# Patient Record
Sex: Female | Born: 1965 | Race: White | Hispanic: No | Marital: Single | Smoking: Former smoker
Health system: Southern US, Community
[De-identification: ages and names within clinical notes are randomized; demographics above are authoritative.]

## PROBLEM LIST (undated history)

## (undated) DIAGNOSIS — E785 Hyperlipidemia, unspecified: Secondary | ICD-10-CM

## (undated) DIAGNOSIS — H547 Unspecified visual loss: Secondary | ICD-10-CM

## (undated) DIAGNOSIS — Z87440 Personal history of urinary (tract) infections: Secondary | ICD-10-CM

## (undated) DIAGNOSIS — F509 Eating disorder, unspecified: Secondary | ICD-10-CM

## (undated) DIAGNOSIS — F419 Anxiety disorder, unspecified: Secondary | ICD-10-CM

## (undated) DIAGNOSIS — G4700 Insomnia, unspecified: Principal | ICD-10-CM

## (undated) DIAGNOSIS — K219 Gastro-esophageal reflux disease without esophagitis: Secondary | ICD-10-CM

## (undated) DIAGNOSIS — F329 Major depressive disorder, single episode, unspecified: Secondary | ICD-10-CM

## (undated) DIAGNOSIS — R55 Syncope and collapse: Secondary | ICD-10-CM

## (undated) DIAGNOSIS — Z124 Encounter for screening for malignant neoplasm of cervix: Secondary | ICD-10-CM

## (undated) DIAGNOSIS — F32A Depression, unspecified: Secondary | ICD-10-CM

## (undated) DIAGNOSIS — A630 Anogenital (venereal) warts: Secondary | ICD-10-CM

## (undated) DIAGNOSIS — H9319 Tinnitus, unspecified ear: Secondary | ICD-10-CM

## (undated) DIAGNOSIS — M5481 Occipital neuralgia: Secondary | ICD-10-CM

## (undated) HISTORY — DX: Anogenital (venereal) warts: A63.0

## (undated) HISTORY — DX: Depression, unspecified: F32.A

## (undated) HISTORY — DX: Encounter for screening for malignant neoplasm of cervix: Z12.4

## (undated) HISTORY — DX: Personal history of urinary (tract) infections: Z87.440

## (undated) HISTORY — DX: Syncope and collapse: R55

## (undated) HISTORY — DX: Unspecified visual loss: H54.7

## (undated) HISTORY — DX: Major depressive disorder, single episode, unspecified: F32.9

## (undated) HISTORY — DX: Insomnia, unspecified: G47.00

## (undated) HISTORY — DX: Occipital neuralgia: M54.81

## (undated) HISTORY — DX: Tinnitus, unspecified ear: H93.19

## (undated) HISTORY — DX: Eating disorder, unspecified: F50.9

## (undated) HISTORY — DX: Hyperlipidemia, unspecified: E78.5

## (undated) HISTORY — DX: Gastro-esophageal reflux disease without esophagitis: K21.9

## (undated) HISTORY — DX: Anxiety disorder, unspecified: F41.9

---

## 2005-07-15 HISTORY — PX: TOTAL ABDOMINAL HYSTERECTOMY: SHX209

## 2010-07-11 ENCOUNTER — Emergency Department (HOSPITAL_COMMUNITY): Admission: EM | Admit: 2010-07-11 | Discharge: 2010-07-11 | Payer: Self-pay | Admitting: Family Medicine

## 2010-10-29 ENCOUNTER — Telehealth: Payer: Self-pay | Admitting: Family

## 2010-10-29 ENCOUNTER — Ambulatory Visit: Payer: Self-pay | Admitting: Family

## 2010-10-29 DIAGNOSIS — E8941 Symptomatic postprocedural ovarian failure: Secondary | ICD-10-CM

## 2010-10-29 DIAGNOSIS — F32A Depression, unspecified: Secondary | ICD-10-CM

## 2010-10-29 DIAGNOSIS — K219 Gastro-esophageal reflux disease without esophagitis: Secondary | ICD-10-CM

## 2010-10-29 DIAGNOSIS — K602 Anal fissure, unspecified: Secondary | ICD-10-CM | POA: Insufficient documentation

## 2010-10-29 DIAGNOSIS — F329 Major depressive disorder, single episode, unspecified: Secondary | ICD-10-CM

## 2010-10-29 DIAGNOSIS — R252 Cramp and spasm: Secondary | ICD-10-CM

## 2010-10-29 DIAGNOSIS — F419 Anxiety disorder, unspecified: Secondary | ICD-10-CM

## 2010-10-29 HISTORY — DX: Depression, unspecified: F32.A

## 2010-10-29 HISTORY — DX: Gastro-esophageal reflux disease without esophagitis: K21.9

## 2010-10-29 LAB — CONVERTED CEMR LAB
Albumin: 5 g/dL (ref 3.5–5.2)
BUN: 15 mg/dL (ref 6–23)
Basophils Relative: 1 % (ref 0–1)
Calcium: 10.3 mg/dL (ref 8.4–10.5)
Eosinophils Absolute: 0.5 10*3/uL (ref 0.0–0.7)
Eosinophils Relative: 6 % — ABNORMAL HIGH (ref 0–5)
Glucose, Bld: 103 mg/dL — ABNORMAL HIGH (ref 70–99)
HCT: 44.5 % (ref 36.0–46.0)
Ketones, urine, test strip: NEGATIVE
Lymphs Abs: 3.1 10*3/uL (ref 0.7–4.0)
MCHC: 33.3 g/dL (ref 30.0–36.0)
MCV: 94.7 fL (ref 78.0–100.0)
Monocytes Relative: 7 % (ref 3–12)
Nitrite: NEGATIVE
Platelets: 258 10*3/uL (ref 150–400)
RBC: 4.7 M/uL (ref 3.87–5.11)
TSH: 4.13 microintl units/mL (ref 0.350–4.500)
Total Bilirubin: 0.9 mg/dL (ref 0.3–1.2)
Urobilinogen, UA: 0.2
WBC: 8.4 10*3/uL (ref 4.0–10.5)

## 2010-10-30 ENCOUNTER — Encounter: Payer: Self-pay | Admitting: Family

## 2010-10-30 ENCOUNTER — Telehealth: Payer: Self-pay | Admitting: Family

## 2010-10-30 ENCOUNTER — Encounter: Payer: Self-pay | Admitting: Internal Medicine

## 2010-10-30 LAB — CONVERTED CEMR LAB: Hgb A1c MFr Bld: 5.8 % — ABNORMAL HIGH (ref <5.7)

## 2010-11-01 ENCOUNTER — Telehealth: Payer: Self-pay | Admitting: Internal Medicine

## 2010-11-05 ENCOUNTER — Encounter: Payer: Self-pay | Admitting: Physician Assistant

## 2010-11-05 ENCOUNTER — Ambulatory Visit: Payer: Self-pay | Admitting: Gastroenterology

## 2010-11-05 DIAGNOSIS — K5900 Constipation, unspecified: Secondary | ICD-10-CM

## 2010-11-05 DIAGNOSIS — K625 Hemorrhage of anus and rectum: Secondary | ICD-10-CM

## 2010-11-05 LAB — CONVERTED CEMR LAB: H Pylori IgG: NEGATIVE

## 2010-11-13 ENCOUNTER — Ambulatory Visit (HOSPITAL_BASED_OUTPATIENT_CLINIC_OR_DEPARTMENT_OTHER)
Admission: RE | Admit: 2010-11-13 | Discharge: 2010-11-13 | Payer: Self-pay | Source: Home / Self Care | Admitting: Internal Medicine

## 2010-11-13 ENCOUNTER — Ambulatory Visit: Payer: Self-pay | Admitting: Diagnostic Radiology

## 2010-11-13 IMAGING — US US ABDOMEN COMPLETE
1 series · 13 of 25 positions shown · non-contrast
Comparison: None

CLINICAL DATA: History of epigastric pain.  Postprandial abdominal
tenderness and bloating.

ABDOMINAL ULTRASOUND COMPLETE

[Series 1: us abdomen complete · 0.41mm/px · 13 of 65 slices shown]
[im 1/65]
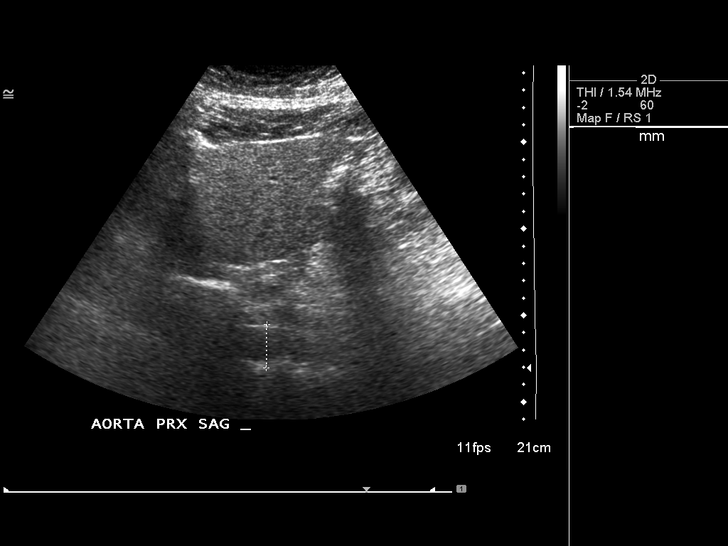
[im 6/65]
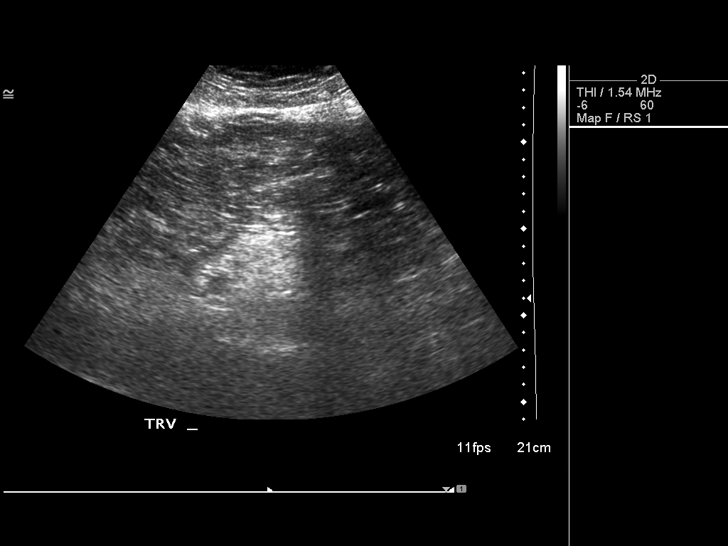
[im 11/65]
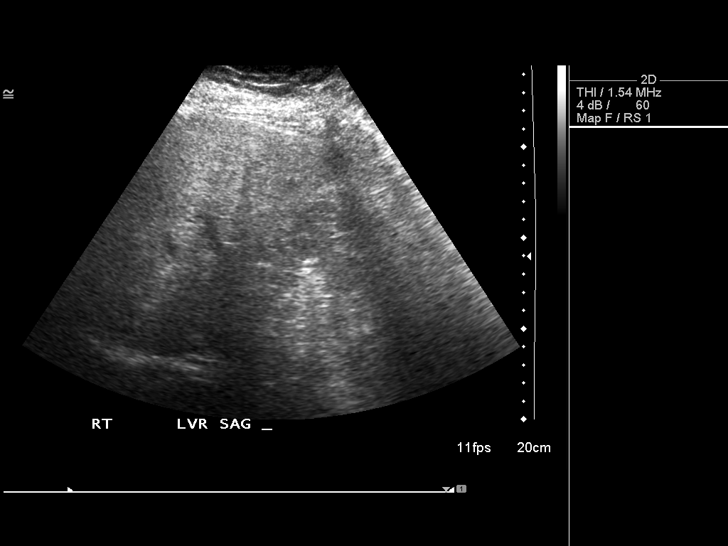
[im 17/65]
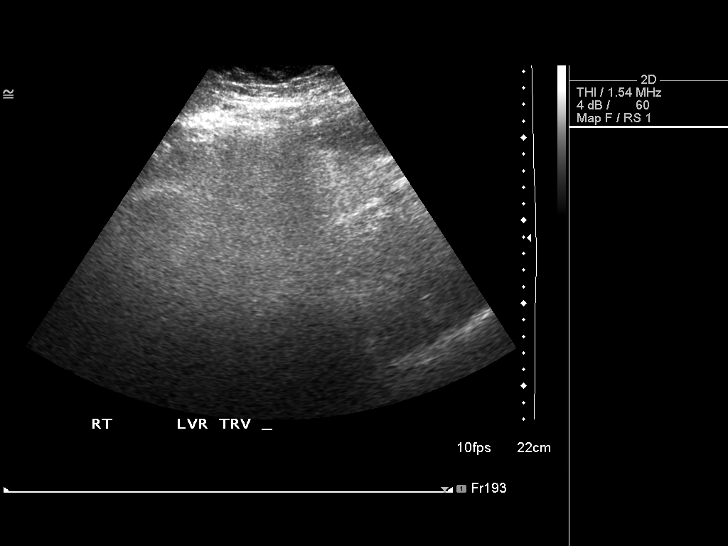
[im 22/65]
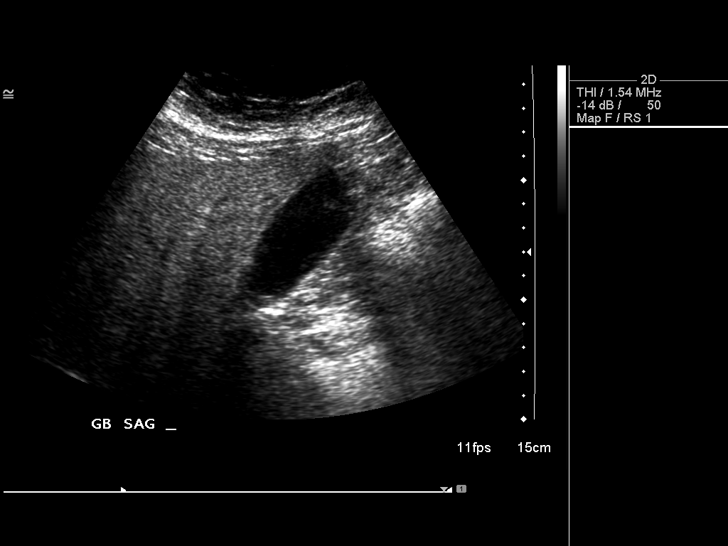
[im 27/65]
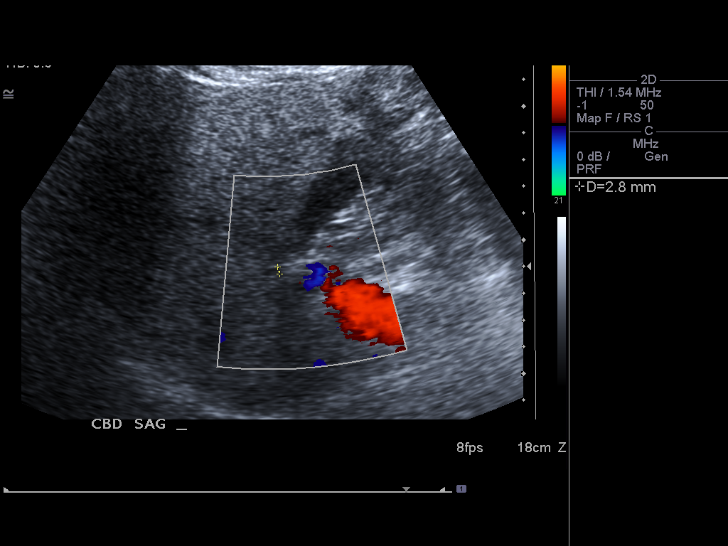
[im 33/65]
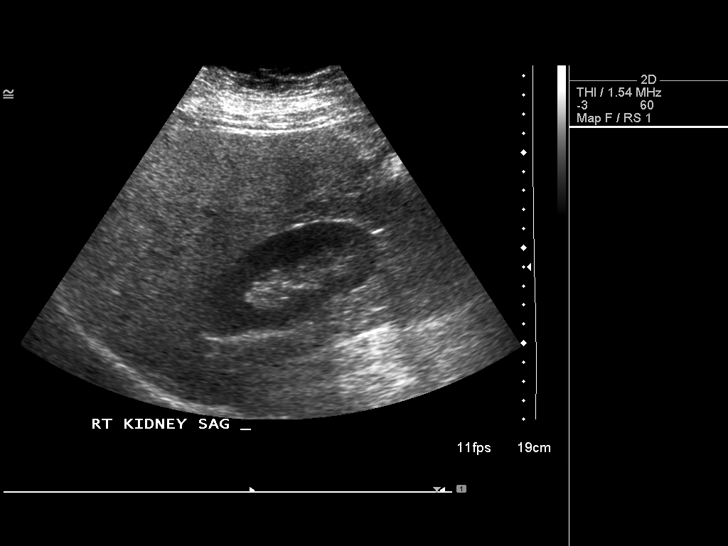
[im 38/65]
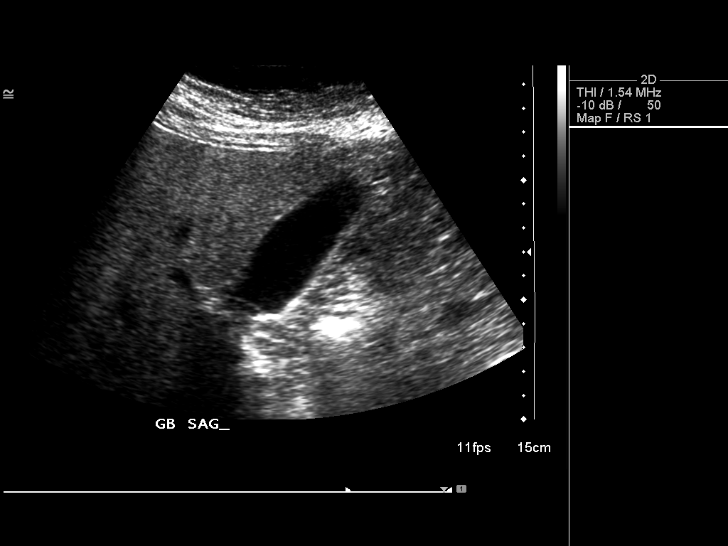
[im 43/65]
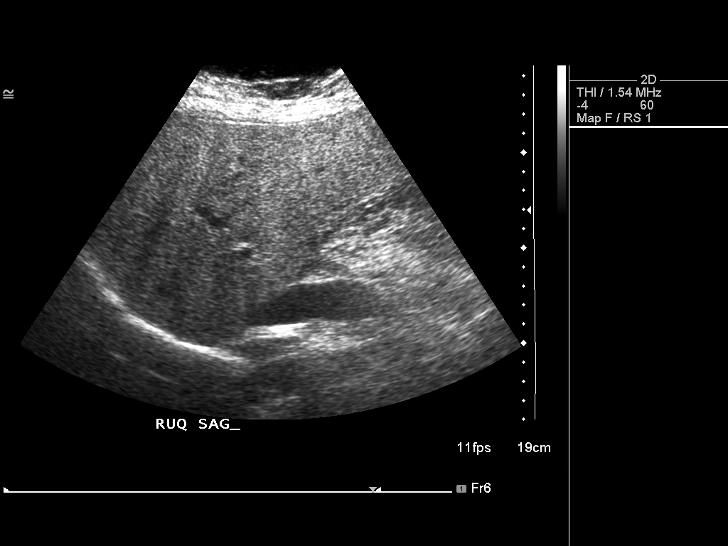
[im 49/65]
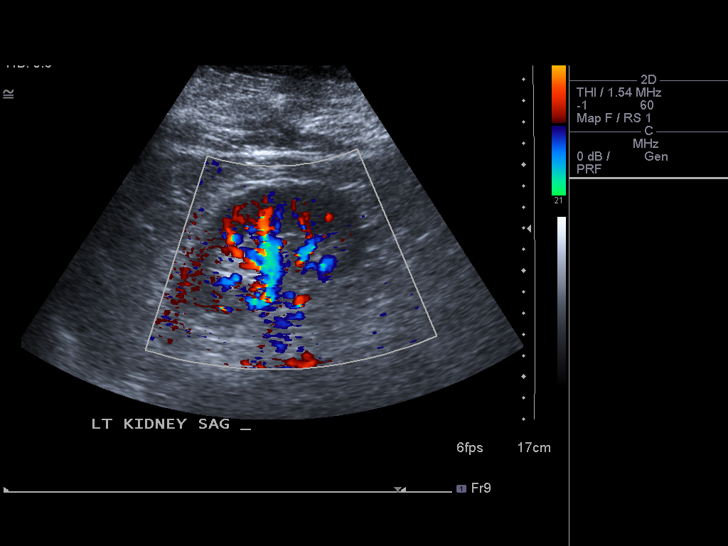
[im 54/65]
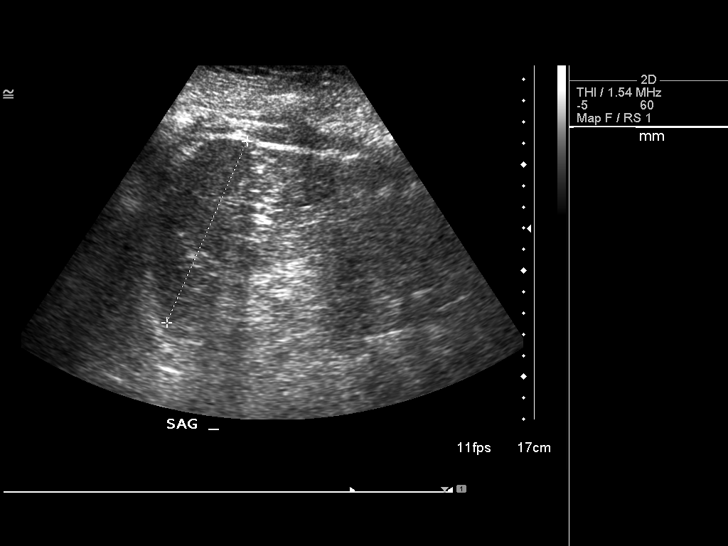
[im 59/65]
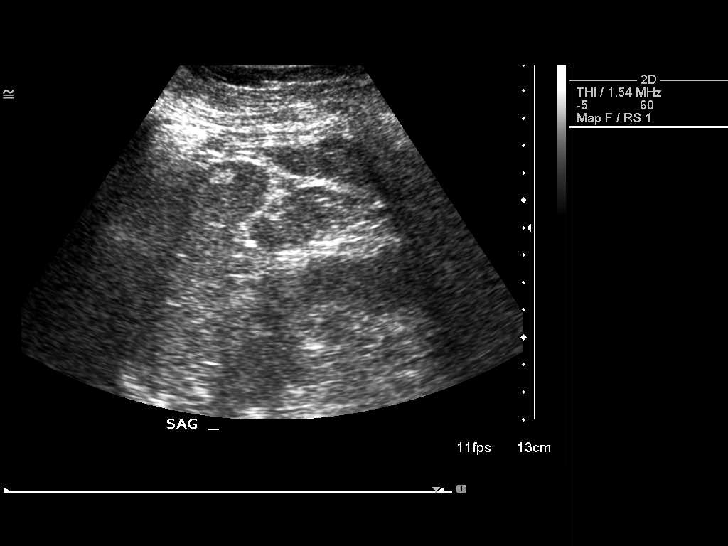
[im 65/65]
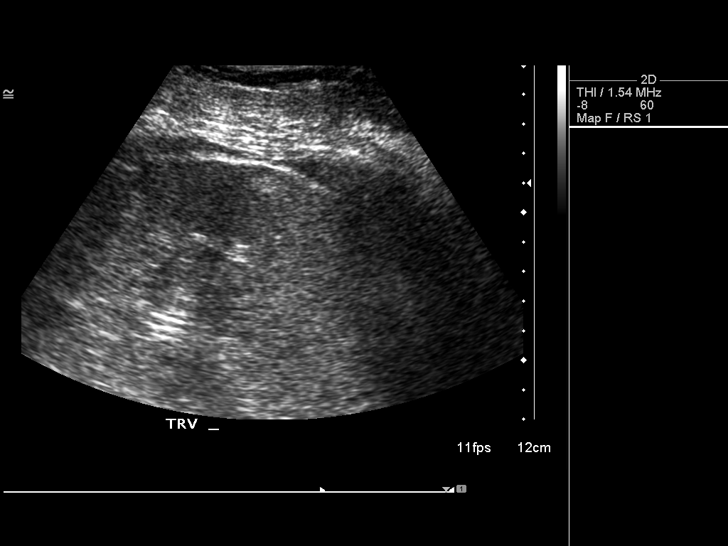

[13 of 25 positions shown; findings below may reference images not displayed]

FINDINGS: Body habitus and overlying bowel gas compromised some of
the imaging.

Gallbladder: No shadowing gallstones or echogenic sludge. No
gallbladder wall thickening or pericholecystic fluid. The
gallbladder wall thickness measured 2 mm.

CBD: Normal in caliber measuring 3 mm. No choledocholithiasis is
evident.

Liver:  Normal size with increased echogenicity of parenchymal
echotexture without focal parenchymal abnormality.

IVC:  Patent throughout its visualized course in the abdomen.

Pancreas:  Although the pancreas is difficult to visualize in its
entirety, no focal pancreatic abnormality is identified.  The
distal body and tail of the pancreas were obscured by overlying
bowel gas.

Spleen:  Normal size and echotexture.  There is a smoothly
marginated  solid appearing hyperechoic oval area measuring 10 x 9
x 6 mm.  Splenic length is 9.3 cm.

Right kidney:  No hydronephrosis.  Well-preserved cortex.  Normal
parenchymal echotexture without focal abnormalities.  Right renal
length is 10.3 cm.

Left kidney:  No hydronephrosis.  Well-preserved cortex.  Normal
parenchymal echotexture without focal abnormalities.  Left renal
length is 10.3 cm.

Aorta:  Maximum diameter is 2.5 cm.  No aneurysm is evident.

Ascites:  None.
IMPRESSION: There is increased echogenicity of the hepatic
parenchyma.  Most commonly this appearance is associated with
hepatic steatosis.

There is a small focal nodular area within the spleen.  This is
hyperechoic and small with smooth margins.  Most commonly this
would represent cavernous hemangioma of the spleen.

No cholelithiasis is seen.

## 2010-11-14 LAB — CONVERTED CEMR LAB: Tissue Transglutaminase Ab, IgA: 10.8 units (ref <20)

## 2010-11-19 ENCOUNTER — Telehealth: Payer: Self-pay | Admitting: Family

## 2010-11-20 ENCOUNTER — Telehealth (INDEPENDENT_AMBULATORY_CARE_PROVIDER_SITE_OTHER): Payer: Self-pay | Admitting: *Deleted

## 2010-11-22 ENCOUNTER — Telehealth: Payer: Self-pay | Admitting: Family

## 2010-11-22 ENCOUNTER — Encounter: Payer: Self-pay | Admitting: Family

## 2010-11-22 DIAGNOSIS — R739 Hyperglycemia, unspecified: Secondary | ICD-10-CM

## 2010-11-22 DIAGNOSIS — N809 Endometriosis, unspecified: Secondary | ICD-10-CM | POA: Insufficient documentation

## 2010-11-22 DIAGNOSIS — E785 Hyperlipidemia, unspecified: Secondary | ICD-10-CM

## 2010-11-22 HISTORY — DX: Hyperlipidemia, unspecified: E78.5

## 2010-11-27 ENCOUNTER — Encounter
Admission: RE | Admit: 2010-11-27 | Discharge: 2010-11-27 | Payer: Self-pay | Source: Home / Self Care | Attending: Obstetrics and Gynecology | Admitting: Obstetrics and Gynecology

## 2010-11-29 ENCOUNTER — Ambulatory Visit: Payer: Self-pay | Admitting: Gastroenterology

## 2010-11-29 ENCOUNTER — Ambulatory Visit: Payer: Self-pay | Admitting: Family

## 2010-12-02 LAB — HM MAMMOGRAPHY: HM Mammogram: NORMAL

## 2010-12-03 ENCOUNTER — Ambulatory Visit: Payer: Self-pay | Admitting: Family

## 2011-01-14 NOTE — Progress Notes (Signed)
Summary: miralax  Phone Note Outgoing Call   Summary of Call: Please call patient and let her know that I would like for her to add miralax once a day to help with her constipation.  See below. Initial call taken by: Lemont Fillers FNP,  October 30, 2010 9:28 AM  Follow-up for Phone Call        Pt notified. Pt asked if she should take Zegerid until she returns for follow up or for 2 weeks only. Advised pt to continue Zegerid until f/u in 1 month and can discuss continuation at that time. Nicki Guadalajara Fergerson CMA Duncan Dull)  October 30, 2010 10:47 AM   Additional Follow-up for Phone Call Additional follow up Details #1::        Continue zegerid until we instruct her otherwise. Additional Follow-up by: Lemont Fillers FNP,  October 30, 2010 11:02 AM    New/Updated Medications: MIRALAX  POWD (POLYETHYLENE GLYCOL 3350) 17 grams by mouth in 8 ounces of water once daily

## 2011-01-14 NOTE — Letter (Signed)
Summary: New Patient letter  [facility]  [address] [place], [place] [address]   Phone: [phone]  Fax: [phone]       10/30/2010 MRN: [id number]  Jordan Ellison [address] [place], [place]  [address]  Dear Ms. Kirn,  Welcome to the Gastroenterology Division at [facility].    You are scheduled to see Dr.  Leone Payor on 12-13-10 at 2:45pm on the 3rd floor at [facility], [address].  We ask that you try to arrive at our office 15 minutes prior to your appointment time to allow for check-in.  We would like you to complete the enclosed self-administered evaluation form prior to your visit and bring it with you on the day of your appointment.  We will review it with you.  Also, please bring a complete list of all your medications or, if you prefer, bring the medication bottles and we will list them.  Please bring your insurance card so that we may make a copy of it.  If your insurance requires a referral to see a specialist, please bring your referral form from your primary care physician.  Co-payments are due at the time of your visit and may be paid by cash, check or credit card.     Your office visit will consist of a consult with your physician (includes a physical exam), any laboratory testing he/she may order, scheduling of any necessary diagnostic testing (e.g. x-ray, ultrasound, CT-scan), and scheduling of a procedure (e.g. Endoscopy, Colonoscopy) if required.  Please allow enough time on your schedule to allow for any/all of these possibilities.    If you cannot keep your appointment, please call [phone] to cancel or reschedule prior to your appointment date.  This allows [place] the opportunity to schedule an appointment for another patient in need of care.  If you do not cancel or reschedule by 5 p.m. the business day prior to your appointment date, you will be charged a $50.00 late cancellation/no-show fee.    Thank you for  choosing [facility] for your medical needs.  We appreciate the opportunity to care for you.  Please visit [place] at our website  to learn more about our practice.                     Sincerely,                                                             The Gastroenterology Division

## 2011-01-14 NOTE — Progress Notes (Signed)
Summary: faxed [facility] for Med Records   Phone Note Outgoing Call   Call placed by: Marj Call placed to: [facility]  Summary of Call: faxed request for records to [facility]  Initial call taken by: Roselle Locus,  October 29, 2010 1:33 PM

## 2011-01-14 NOTE — Progress Notes (Signed)
Summary: Appt sooner than 12-30  Phone Note From Other Clinic   Caller: Myriam Jacobson [phone] for Melisa [email or web address] Call For: Dr Leone Payor Reason for Call: Schedule Patient Appt Summary of Call: Would like pt worked in sooner with Dr Leone Payor or with PA. Initial call taken by: Leanor Kail [facility],  November 01, 2010 11:04 AM  Follow-up for Phone Call        patient is rescheduled for 11/05/10 2:00 Follow-up by: Darcey Nora RN, CGRN,  November 01, 2010 11:41 AM

## 2011-01-14 NOTE — Assessment & Plan Note (Signed)
Summary: new to est bcbs/mhf--Rm 5   Vital Signs:  Patient profile:   45 year old female Height:      63.5 inches Weight:      207.50 pounds BMI:     36.31 Temp:     97.8 degrees F oral Pulse rate:   72 / minute Pulse rhythm:   regular Resp:     16 per minute BP sitting:   124 / 90  (right arm) Cuff size:   regular  Vitals Entered By: Mervin Kung CMA [facility] Dull) (October 29, 2010 10:02 AM) CC: New pt to establish care. Is Patient Diabetic? No   Primary Care Lahari Suttles:  Lemont Fillers FNP  CC:  New pt to establish care.Marland Kitchen  History of Present Illness: Ms Kneece is a 45 year old female here to establish care.  She had a PCP at [facility] (Dr. Jake Michaelis comes with several concerns:  1)  Cramps/tightness backs of legs- worse with walking.  Intermittent.  Some "hardness" behind her knee.    2) Hemorrhoids- Notes + history of rectal fissure for several years.   + Constipation, using fiber pill which bothered her stomach.  Bleeding at times- rectal fissure, sometimes hard to sit.  Pt reports + history of colonoscopy about 5 years ago.    3) Bladder issues-   Reports UTI one month ago.  Treated with antiboitics.    4) Abdominal pain- hurts worse with food- epigastric pain postprandial 2 hours.  No improvement with probiotics, prilosec 14 day treatment.    5) Tinnitus-  ears feel full. In   6) S/p TAH- was on vivelle dot-  stopped one year ago on her own, felt that she couldn't tolerate vivelle dot due to nausea.  Preventive Screening-Counseling & Management  Alcohol-Tobacco     Alcohol drinks/day: <1     Alcohol type: wine     Smoking Status: quit     Year Quit: 2000     Pack years: 10  Caffeine-Diet-Exercise     Caffeine use/day: 1 daily     Does Patient Exercise: no     Type of exercise: walking     Times/week: <3  Allergies (verified): 1)  ! Sulfa  Past History:  Past Medical History: Depression Eating disorder Fainting Spells Hyperlipidemia Hx of  UTIs  Past Surgical History: total Hysterectomy--07/2005  Family History: Mother-- deceased; MI, uterine and kidney cancer. Kidney failure. 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 Resp: Denies cough CV:  Denies Chest Pain or shortness of breath GI:  Denies nausea or vomitting GU: see HPI Lymphatic: Denies lymphadenopathy Musculoskeletal:  Denies joint pain Skin:  Denies Rashes Psychiatric: Denies depression,  + history or anxiety Neuro: Denies numbness     Physical Exam  General:  morbidly obese white female Head:  Normocephalic and atraumatic without obvious abnormalities. No apparent alopecia or balding. Eyes:  PERRLA Mouth:  Oral mucosa and oropharynx without lesions or exudates.  Teeth in good repair. Neck:  No deformities, masses, or tenderness noted. Lungs:  Normal respiratory effort, chest expands symmetrically. Lungs are clear  to auscultation, no crackles or wheezes. Heart:  Normal rate and regular rhythm. S1 and S2 normal without gallop, murmur, click, rub or other extra sounds. Abdomen:  Bowel sounds positive,abdomen soft and non-tender without masses, organomegaly or hernias noted. Rectal:  + rectal fissue palpated , heme + Msk:  No deformity or scoliosis noted of thoracic or lumbar spine.   Pulses:  2+ bilateral DP/PT pulses Extremities:  No clubbing, cyanosis, edema, or deformity noted with normal full range of motion of all joints.   Neurologic:  alert & oriented X3 and strength normal in all extremities.    Skin:  Intact without suspicious lesions or rashes Psych:  Oriented X3.  Anxious appearing female.     Impression & Recommendations:  Problem # 1:  LEG CRAMPS (ICD-729.82) Assessment Comment Only Will check baseline laboratories.  If no improvement, consider checking ABIs.  Problem # 2:  MENOPAUSE, SURGICAL (ICD-627.4)  Refer to GYN for assist with HRT.  Will try to obtain old records.   Orders: Gynecologic Referral (Gyn)  Problem # 3:  ANXIETY (ICD-300.00) Assessment: New patient has multiple somatic complaints. From what I can gather these complaints started after the loss of her job. Will monitor consider addition of SSRI if medical workup is negative.  Problem # 4:  RECTAL FISSURE (ICD-565.0)  Will add miralax, heme + stool, will also refer to gi  Orders: Gastroenterology Referral (GI)  Problem # 5:  EPIGASTRIC PAIN (ICD-789.06) Assessment: New  This is worst post-prandial.  Will switch h2b to Zegerid.  Refer to GI for further evaluation.    Orders: Gastroenterology Referral (GI)  Complete Medication List: 1)  Align Caps (Probiotic product) .... Take 1 capsule by mouth once a day. 2)  Zegerid Otc 20-1100 Mg Caps (Omeprazole-sodium bicarbonate) .... One cap by mouth daily 3)  Calcium-magnesium-zinc 333-133-8.3 Mg Tabs (Calcium-magnesium-zinc) .... Take 2 tablets daily. 4)  Ultra-mega Cr-tabs (Multiple vitamins-minerals) .... Take 1 tablet by mouth once a day. 5)  Miralax Powd (Polyethylene glycol 3350) .Marland KitchenMarland KitchenMarland Kitchen 17 grams by mouth in 8 ounces of water once daily  Other Orders: TLB-BMP (Basic Metabolic Panel-BMET) (80048-METABOL) TLB-CBC Platelet - w/Differential (85025-CBCD) TLB-Hepatic/Liver Function Pnl (80076-HEPATIC) TLB-TSH (Thyroid Stimulating Hormone) (84443-TSH) UA Dipstick w/o Micro (manual) (04540) Specimen Handling (99000) T-Culture, Urine ([id number])  Patient Instructions: 1)  You will be contacted about your referral to Gastroenterology. 2)  Please  complete your lab work on the first floor today. 3)  Follow up in 1 month. 4)  Welcome to [facility]!   Orders Added: 1)  TLB-BMP (Basic Metabolic Panel-BMET) [80048-METABOL] 2)  TLB-CBC Platelet - w/Differential [85025-CBCD] 3)  TLB-Hepatic/Liver Function Pnl [80076-HEPATIC] 4)  TLB-TSH (Thyroid Stimulating Hormone) [84443-TSH] 5)  UA Dipstick w/o Micro (manual) [81002] 6)  Specimen Handling [99000] 7)  T-Culture, Urine [[id number]] 8)  Gynecologic Referral [Gyn] 9)  Gastroenterology Referral [GI] 10)  New Patient Level IV [78469]   Immunization History:  Influenza Immunization History:    Influenza:  historical (10/13/2010)   Immunization History:  Influenza Immunization History:    Influenza:  Historical (10/13/2010)  Current Allergies (reviewed today): ! SULFA   Preventive Care Screening  Colonoscopy:    Date:  12/15/2004    Results:  normal   Pap Smear:    Date:  12/15/2004    Results:  normal      Pt never had mammogram. Pt doesn't remember last Tetanus. Nicki Guadalajara Fergerson CMA [facility] Dull)  October 29, 2010 10:21 AM     Laboratory Results  Urine Tests   Date/Time Reported: Mervin Kung CMA [facility] Dull)  October 29, 2010 11:34 AM   Routine Urinalysis   Color: yellow Appearance: Clear Glucose: negative   (Normal Range: Negative) Bilirubin: negative   (Normal Range: Negative) Ketone: negative   (Normal Range: Negative) Spec. Gravity: 1.010   (Normal Range: 1.003-1.035) Blood: trace-lysed   (Normal Range: Negative) pH: 5.0   (Normal Range: 5.0-8.0) Protein: negative   (Normal Range: Negative) Urobilinogen: 0.2   (Normal Range: 0-1) Nitrite: negative   (Normal Range: Negative) Leukocyte Esterace: small   (Normal Range: Negative)    Comments: Urine Cultured. Nicki Guadalajara Fergerson CMA Duncan Dull)  October 29, 2010 11:34 AM

## 2011-01-14 NOTE — Assessment & Plan Note (Signed)
Summary: SUSPECTED RECTAL FISSURE/YF   History of Present Illness Primary GI MD: Elie Goody MD [facility] Primary Provider: Lemont Fillers FNP Requesting Provider: Lemont Fillers FNP Chief Complaint: possible rectal fissure History of Present Illness:   44 YO FEMALE NEW TO GI TODAY . SHE IS REFERRED WITH C/O RECTAL PAIN WHICH SHE THINKS IS A TEAR. SHE REORTS PREVIOUS COLON AND EGD DONE ABOUT 5 YEARS AGO IN [place]. SHE SAYS THEY WERE NEGATIVE BUT SHE DID HAVE A FISSURE THEN AS WELL.  SHE HAS BEN HAVING HARD STOOL THE PAST COUPLE MONTHS, AND GENERALIZED ABDOMINAL DISCOMFORT. SHE WAS ALSO HAVING  A LOT OF LEG CRAMPS AND HAD BEEN TAKING A LOT OF ALEVE WHICH SHE HAS NOW STOPPED. SHE C/O EPIGASTRIC DISCOMFORT, NO HEARTBURN, NO DYSPHAGIA. NO N/V.  SHE HAS BEEN STARTED ON ZEGERID DAILY  JUST OVER THE PAST FEW DAYS AND SAYS IT DOES HELP.  SHE REPORTS BLOOD WITH HER BM'S ALMOST EVERY BM. HAS NOT STARTED MIRALAX YET. SHE DESCRIBES PAINFUL BM'S, AND A SWOLLEN FEELING IN HER RECTUM.   GI Review of Systems    Reports abdominal pain, acid reflux, belching, bloating, chest pain, loss of appetite, and  nausea.     Location of  Abdominal pain: generalized.    Denies dysphagia with liquids, dysphagia with solids, heartburn, vomiting, vomiting blood, weight loss, and  weight gain.      Reports constipation, hemorrhoids, irritable bowel syndrome, and  rectal bleeding.     Denies anal fissure, black tarry stools, change in bowel habit, diarrhea, diverticulosis, fecal incontinence, heme positive stool, jaundice, light color stool, liver problems, and  rectal pain.   Current Medications (verified): 1)  Align  Caps (Probiotic Product) .... Take 1 Capsule By Mouth Once A Day. 2)  Zegerid Otc 20-1100 Mg Caps (Omeprazole-Sodium Bicarbonate) .... One Cap By Mouth Daily 3)  Calcium-Magnesium-Zinc 333-133-8.3 Mg Tabs (Calcium-Magnesium-Zinc) .... Take 2 Tablets Daily. 4)  Ultra-Mega  Cr-Tabs (Multiple  Vitamins-Minerals) .... Take 1 Tablet By Mouth Once A Day. 5)  Miralax  Powd (Polyethylene Glycol 3350) .Marland KitchenMarland KitchenMarland Kitchen 17 Grams By Mouth in 8 Ounces of Water Once Daily  Allergies (verified): 1)  ! Sulfa  Past History:  Past Medical History: Reviewed history from 10/29/2010 and no changes required. Depression Eating disorder Fainting Spells Hyperlipidemia Hx of UTIs  Past Surgical History: Reviewed history from 10/29/2010 and no changes required. total Hysterectomy--07/2005  Family History: Reviewed history from 10/29/2010 and no changes required. Mother-- deceased; MI, uterine and kidney cancer. Kidney failure. Father-- deceased; MI 29 3 sisters-- heart disease, COPD, diabetes, thyroid problems 7 brothers-- GERD, hyperlipidemia, thyroid disease, epilepsy  Social History: Reviewed history from 10/29/2010 and no changes required. 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  The patient denies allergy/sinus, anemia, anxiety-new, arthritis/joint pain, blood in urine, change in vision, confusion, cough, coughing up blood, fainting, fever, headaches-new, hearing problems, heart murmur, heart rhythm changes, itching, menstrual pain, night sweats, nosebleeds, pregnancy symptoms, shortness of breath, skin rash, sore throat, swelling of feet/legs, swollen lymph glands, thirst - excessive , urination - excessive , urination changes/pain, urine leakage, vision changes, and voice change.         SEE HPI  Vital Signs:  Patient profile:   44 year  old female Height:      63 inches Weight:      209 pounds BMI:     37.16 Pulse rate:   84 / minute Pulse rhythm:   regular BP sitting:   120 / 68  (left arm)  Vitals  Entered By: Chales Abrahams CMA [facility] Dull) (November 05, 2010 2:01 PM)  Physical Exam  General:  Well developed, well nourished, no acute distress. Head:  Normocephalic and atraumatic. Eyes:  PERRLA, no icterus. Lungs:  Clear throughout to auscultation. Heart:  Regular rate and rhythm; no murmurs, rubs,  or bruits. Abdomen:  SOFT, MILD TENDERNESS EPIGASTRIUM, NO GUARDING, NO MASS , NO PALP HSM,BS+ Rectal:  EXTERNAL SENTINEL TAG ,SHALLOW FISSURE ON ANOSCOPY POSTERIOR, NO HEME. SHE ALSO HAS SEVERAL SMALL PERIANAL LESIONS WHICH LOOK LIKE WARTS. Extremities:  No clubbing, cyanosis, edema or deformities noted. Neurologic:  Alert and  oriented x4;  grossly normal neurologically. Psych:  Alert and cooperative. Normal mood and affect.  Impression & Recommendations:  Problem # 1:  RECTAL FISSURE (ICD-565.0) 44 YO WITH 2 MONTH HX OF CONSTIPATION, THEN RECTAL PAIN WITH BM'S, HEMATOCHEZIA. SHE HAS A SHALLOW FISSURE ON EXAM. TAKE MIRALAX 17 GM IN 8 OZ OF WATER DAILY ANAMANTLE GEL 3-4 TIMES DAILY  FOR 2-3 WEEKS, IF NOT HELPING WILL TRY DILTIAZEM. OBTAIN RECORDS FROM [place]-EGD/COLON. PT HAS ANAL WARTS- SHE HAS UPCOMING APPT WITH GYN AND WILL DISCUSS WITH HER GYN REGARDING TREATMENT. Orders: TLB-H. Pylori Abs(Helicobacter Pylori) (86677-HELICO) T-Sprue Panel (Celiac Disease Aby Eval) (83516x3/[address])  Problem # 2:  CONSTIPATION (ICD-564.00) Assessment: Comment Only SEE ABOVE  Problem # 3:  EPIGASTRIC PAIN (ICD-789.06) Assessment: Improved SUSPECT NSAID INDUCED GASTROPATHY, R/O H.PYLORI. SHE HAS HAD LONG TERM, INTERMITTENT SXS. CHECK H.PYLORI AB, CELIAC PANEL CONTINUE ZEGERID 20 MG DAILY  AVOID NSAIDS ROV IN 2-3 WEEKS WITH DR. Russella Dar OR MYSELF Orders: TLB-H. Pylori Abs(Helicobacter Pylori) (86677-HELICO) T-Sprue Panel (Celiac Disease Aby Eval) (83516x3/[address])  Problem # 4:  ANXIETY (ICD-300.00) Assessment: Comment Only  Patient Instructions: 1)  We called in a prescrption for  Lidomantle (Lidocaine HC 3 % cream) to [facility] in [place]. 2)  Please go to lab, basement level. 3)  Use the Miralax  daily. 4)  Continue the Zegerid. 5)  Call Pam about appt with Amy on [phone] ) 6)  Copy sent to : Lemont Fillers FNP 7)  The medication list was reviewed and reconciled.  All changed / newly prescribed medications were explained.  A complete medication list was provided to the patient / caregiver.  Prescriptions: LIDOCAINE HCL 3 % CREA (LIDOCAINE HCL) Use rectally 3-4 times daily  #1 kit x 1   Entered by:   Lowry Ram NCMA   Authorized by:   Sammuel Cooper PA-c   Signed by:   Sammuel Cooper PA-c on 11/06/2010   Method used:   Telephoned to ...       [facility]  [address] [phone]* (retail)       [address]       [place], [place]  [address]       Ph: [phone] or [phone]       Fax: [phone]   RxID:   [id number]

## 2011-01-14 NOTE — Progress Notes (Signed)
Summary: Appt w/ Mike Gip PA  Phone Note Call from Patient   Caller: Patient Call For: Lowry Ram Summary of Call: The pt was to call me last week and forgot. I did ask her how she was doing and she still is having persistent epigastric pain and is asking to see Amy again.  I did put her in for 11-29-2010 at 11:00 Am.  Initial call taken by: Joselyn Glassman,  November 20, 2010 4:15 PM

## 2011-01-14 NOTE — Progress Notes (Signed)
Summary: needs additional labs  Phone Note Outgoing Call   Summary of Call: Please call patient and let her know that I have reviewed her medical records from [facility].  Her sugar and her cholesterol were high in their records.  I would like for her to schedule a CPX and  come 1 week prior for for a FLP (272.4). Initial call taken by: Lemont Fillers FNP,  November 22, 2010 9:12 AM  Follow-up for Phone Call        [place], do you want her to have testing for her blood sugar as well? Nicki Guadalajara Fergerson CMA [facility] Dull)  November 22, 2010 10:55 AM   Additional Follow-up for Phone Call Additional follow up Details #1::        No, A1C was done last visit. Thanks Additional Follow-up by: Lemont Fillers FNP,  November 22, 2010 11:00 AM    Additional Follow-up for Phone Call Additional follow up Details #2::    Pt notified per [facility] instructions. CPX scheduled for 12/03/10 @ 2:45pm.  Pt is aware to have fasting lipid panel 1 week prior to appt. Order placed and faxed to the lab. Nicki Guadalajara Fergerson CMA Duncan Dull)  November 22, 2010 11:18 AM

## 2011-01-14 NOTE — Progress Notes (Signed)
Summary: Records rec'd:  Dr Debroah Loop, [facility]  Phone Note Other Incoming   Summary of Call: Records received from Dr Debroah Loop,  [facility]. Nicki Guadalajara Fergerson CMA Duncan Dull)  November 19, 2010 11:36 AM

## 2011-01-14 NOTE — Miscellaneous (Signed)
  Clinical Lists Changes  Problems: Added new problem of ENDOMETRIOSIS (ICD-617.9) - s/p Hysterectomy Added new problem of HYPERLIPIDEMIA (ICD-272.4) Added new problem of HYPERGLYCEMIA (ICD-790.29)

## 2011-01-14 NOTE — Progress Notes (Signed)
Summary: Pt returned your call  Phone Note Outgoing Call Call back at [phone]   Call placed by: Lemont Fillers FNP,  October 30, 2010 11:45 AM Call placed to: Patient Summary of Call: Left message for patient to return my call. Initial call taken by: Lemont Fillers FNP,  October 30, 2010 11:45 AM  Follow-up for Phone Call        Pt returned your call. Nicki Guadalajara Fergerson CMA Duncan Dull)  October 31, 2010 8:21 AM   Additional Follow-up for Phone Call Additional follow up Details #1::        Reviewed lab results including elevated sugar and plans to order ultrasound.   Pt is requesting earlier GI apt.  Myriam Jacobson, could you please see if we can have Amy  or Gunnar Fusi see her sooner over at [facility]? Thanks Additional Follow-up by: Lemont Fillers FNP,  November 01, 2010 9:50 AM    Additional Follow-up for Phone Call Additional follow up Details #2::    Appt [place]  GI   -[facility]my [facility]  November 22, pt notified Follow-up by: Darral Dash,  November 01, 2010 2:55 PM

## 2011-01-16 NOTE — Letter (Signed)
Summary: Records Dated 07-23-07 thru 10-08-07/[facility]  Records Dated 07-23-07 thru 10-08-07/[facility]   Imported By: Lanelle Bal 11/30/2010 11:55:32  _____________________________________________________________________  External Attachment:    Type:   Image     Comment:   External Document

## 2011-03-01 LAB — URINE CULTURE

## 2011-03-01 LAB — POCT URINALYSIS DIP (DEVICE)
Nitrite: NEGATIVE
Protein, ur: NEGATIVE mg/dL
Specific Gravity, Urine: 1.01 (ref 1.005–1.030)
Urobilinogen, UA: 0.2 mg/dL (ref 0.0–1.0)

## 2011-04-21 ENCOUNTER — Encounter: Payer: Self-pay | Admitting: Internal Medicine

## 2011-04-21 ENCOUNTER — Encounter: Payer: Self-pay | Admitting: *Deleted

## 2011-04-21 NOTE — Telephone Encounter (Signed)
This encounter was created in error - please disregard.

## 2011-04-22 ENCOUNTER — Encounter: Payer: Self-pay | Admitting: Internal Medicine

## 2011-04-22 ENCOUNTER — Ambulatory Visit: Payer: BC Managed Care – PPO | Admitting: Internal Medicine

## 2011-04-22 VITALS — BP 110/80 | HR 87 | Temp 98.1°F | Resp 22 | Ht 63.5 in | Wt 215.0 lb

## 2011-04-22 DIAGNOSIS — R143 Flatulence: Secondary | ICD-10-CM

## 2011-04-22 DIAGNOSIS — R141 Gas pain: Secondary | ICD-10-CM

## 2011-04-22 DIAGNOSIS — R142 Eructation: Secondary | ICD-10-CM

## 2011-04-22 DIAGNOSIS — R5382 Chronic fatigue, unspecified: Secondary | ICD-10-CM | POA: Insufficient documentation

## 2011-04-22 DIAGNOSIS — R140 Abdominal distension (gaseous): Secondary | ICD-10-CM | POA: Insufficient documentation

## 2011-04-22 LAB — HEPATIC FUNCTION PANEL
Alkaline Phosphatase: 69 U/L (ref 39–117)
Bilirubin, Direct: 0.1 mg/dL (ref 0.0–0.3)
Indirect Bilirubin: 0.6 mg/dL (ref 0.0–0.9)
Total Bilirubin: 0.7 mg/dL (ref 0.3–1.2)

## 2011-04-22 LAB — CBC WITH DIFFERENTIAL/PLATELET
Basophils Relative: 0 % (ref 0–1)
HCT: 41.6 % (ref 36.0–46.0)
Hemoglobin: 14.2 g/dL (ref 12.0–15.0)
Lymphocytes Relative: 32 % (ref 12–46)
MCHC: 34.1 g/dL (ref 30.0–36.0)
Monocytes Absolute: 0.9 10*3/uL (ref 0.1–1.0)
Monocytes Relative: 8 % (ref 3–12)
Neutro Abs: 6.2 10*3/uL (ref 1.7–7.7)

## 2011-04-22 LAB — IRON AND TIBC
%SAT: 17 % — ABNORMAL LOW (ref 20–55)
TIBC: 326 ug/dL (ref 250–470)
UIBC: 271 ug/dL

## 2011-04-22 LAB — T4, FREE: Free T4: 1.07 ng/dL (ref 0.80–1.80)

## 2011-04-22 MED ORDER — OMEPRAZOLE 40 MG PO CPDR: 40.0000 mg | DELAYED_RELEASE_CAPSULE | Freq: Every day | ORAL | Status: DC

## 2011-04-22 NOTE — Patient Instructions (Addendum)
Follow 1200 calorie diet Avoid heavy fat or protein meals

## 2011-04-22 NOTE — Progress Notes (Signed)
Subjective:    Patient ID: Jordan Ellison, female    DOB: May 11, 1966, 45 y.o.   MRN: [id number]  HPI  Pt had colonoscopy completed  in the past for chronic constipation - 2006.  Pt has chronic abd bloating / pressure sensation.  She notes early satiety.  "Terrible feeling after eating" .   After starting zegerid, her symptoms got less intense.    Early satiety symptoms has not changed.  Trying to go to the bathroom causes shortness of breath.  Intermittent NSAID use - 2 x per week.  It the past, pt was using NSAIDs 3-4 x per week.   occ nausea but no vomiting.  After using miralax she has BM daily.  Following high fiber diet seems to be helping.      Review of Systems     Past Medical History  Diagnosis Date  . Depression   . Eating disorder   . Fainting spell   . Hyperlipidemia   . History of UTI     History   Social History  . Marital Status: Single    Spouse Name: N/A    Number of Children: N/A  . Years of Education: N/A   Occupational History  . Not on file.   Social History Main Topics  . Smoking status: Former Games developer  . Smokeless tobacco: Not on file   Comment: quit smoking in 2000  . Alcohol Use: Yes     occasional glass of wine  . Drug Use: Not on file  . Sexually Active: Not on file   Other Topics Concern  . 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 Total abdominal hysterectomy 07/2005    Family History  Problem Relation Age of Onset  . Uterine cancer Mother   . Kidney cancer Mother   . Kidney failure Mother   . Heart attack Father     deceased in 43  . Heart disease Sister   . COPD Sister   . Diabetes Sister   . Thyroid disease Sister   . GER disease Brother   . Hyperlipidemia  Brother   . Thyroid disease Brother   . Seizures Brother   . Other      3 sisters/ 7 brothers    Allergies  Allergen Reactions  . Sulfonamide Derivatives     REACTION: fever, weakness    Current Outpatient Prescriptions on File Prior to Visit  Medication Sig Dispense Refill  . polyethylene glycol (MIRALAX / GLYCOLAX) packet Take 17 g by mouth daily.        . Calcium-Magnesium-Zinc (CAL-MAG-ZINC PO) Take 2 tablets by mouth daily.        Marland Kitchen lidocaine (LINDAMANTLE) 3 % CREA cream Apply topically. Use rectally 3-4 times daily       . Omeprazole-Sodium Bicarbonate (ZEGERID) 20-1100 MG CAPS Take 1 capsule by mouth daily.        . Probiotic Product (ALIGN PO) Take 1 capsule by mouth daily.          BP 110/80  Pulse 87  Temp(Src) 98.1 F (36.7 C) (Oral)  Resp 22  Ht 5' 3.5" (1.613 m)  Wt 215 lb (97.523 kg)  BMI 37.49 kg/m2  SpO2 97%    Objective:   Physical Exam  Constitutional: Appears well-developed and well-nourished. No distress.  HENT:  Head: Normocephalic and atraumatic.  Right Ear: External ear normal.  Left Ear: External ear normal.  Mouth/Throat: Oropharynx is clear and moist.  Eyes: Conjunctivae are normal. Pupils are equal, round, and reactive to light.  Neck: Normal range of motion. Neck supple. No thyromegaly present.       No carotid bruit  Cardiovascular: Normal rate, regular rhythm and normal heart sounds.  Exam reveals no gallop and no friction rub.   No murmur heard. Pulmonary/Chest: Effort normal and breath sounds normal.  No wheezes. No rales.  Abdominal: Soft. Bowel sounds are normal. No mass. There is no tenderness.  Neurological: Alert. No cranial nerve deficit.  Skin: Skin is warm and dry.  Psychiatric: Normal mood and affect. Behavior is normal.      Assessment & Plan:

## 2011-04-23 LAB — BASIC METABOLIC PANEL WITH GFR
BUN: 15 mg/dL (ref 6–23)
Chloride: 101 mEq/L (ref 96–112)
GFR, Est Non African American: >60 mL/min (ref >60)
Potassium: 4.3 mEq/L (ref 3.5–5.3)

## 2011-04-24 ENCOUNTER — Ambulatory Visit (HOSPITAL_BASED_OUTPATIENT_CLINIC_OR_DEPARTMENT_OTHER)
Admission: RE | Admit: 2011-04-24 | Discharge: 2011-04-24 | Disposition: A | Discharge status: Home / Self Care | Payer: BC Managed Care – PPO | Source: Ambulatory Visit | Attending: Internal Medicine | Admitting: Internal Medicine

## 2011-04-24 DIAGNOSIS — Z90710 Acquired absence of both cervix and uterus: Secondary | ICD-10-CM

## 2011-04-24 DIAGNOSIS — R142 Eructation: Secondary | ICD-10-CM | POA: Insufficient documentation

## 2011-04-24 DIAGNOSIS — R140 Abdominal distension (gaseous): Secondary | ICD-10-CM

## 2011-04-24 DIAGNOSIS — R143 Flatulence: Secondary | ICD-10-CM | POA: Insufficient documentation

## 2011-04-24 DIAGNOSIS — R109 Unspecified abdominal pain: Secondary | ICD-10-CM

## 2011-04-24 DIAGNOSIS — R141 Gas pain: Secondary | ICD-10-CM | POA: Insufficient documentation

## 2011-04-24 DIAGNOSIS — K7689 Other specified diseases of liver: Secondary | ICD-10-CM | POA: Insufficient documentation

## 2011-04-24 MED ORDER — IOHEXOL 300 MG/ML  SOLN
100.0000 mL | Freq: Once | INTRAMUSCULAR | Status: AC | PRN
  Administered 2011-04-24: 100 mL via INTRAVENOUS

## 2011-05-06 ENCOUNTER — Ambulatory Visit: Payer: BC Managed Care – PPO | Admitting: Internal Medicine

## 2011-05-13 ENCOUNTER — Other Ambulatory Visit (HOSPITAL_COMMUNITY): Payer: BC Managed Care – PPO

## 2011-05-13 ENCOUNTER — Encounter (HOSPITAL_COMMUNITY)
Admission: RE | Admit: 2011-05-13 | Discharge: 2011-05-13 | Disposition: A | Discharge status: Home / Self Care | Payer: BC Managed Care – PPO | Source: Ambulatory Visit | Attending: Internal Medicine | Admitting: Internal Medicine

## 2011-05-13 ENCOUNTER — Encounter (HOSPITAL_COMMUNITY): Payer: Self-pay

## 2011-05-13 DIAGNOSIS — R142 Eructation: Secondary | ICD-10-CM | POA: Insufficient documentation

## 2011-05-13 DIAGNOSIS — R141 Gas pain: Secondary | ICD-10-CM | POA: Insufficient documentation

## 2011-05-13 DIAGNOSIS — R140 Abdominal distension (gaseous): Secondary | ICD-10-CM

## 2011-05-13 MED ORDER — TECHNETIUM TC 99M SULFUR COLLOID
2.0000 | Freq: Once | INTRAVENOUS | Status: AC | PRN
  Administered 2011-05-13: 2 via ORAL

## 2011-05-22 ENCOUNTER — Ambulatory Visit: Payer: BC Managed Care – PPO | Admitting: Internal Medicine

## 2011-05-22 ENCOUNTER — Ambulatory Visit: Payer: BC Managed Care – PPO | Admitting: Family Medicine

## 2011-06-02 ENCOUNTER — Ambulatory Visit: Payer: BC Managed Care – PPO | Admitting: Internal Medicine

## 2011-06-06 ENCOUNTER — Ambulatory Visit: Payer: BC Managed Care – PPO | Admitting: Family Medicine

## 2011-06-09 ENCOUNTER — Ambulatory Visit (INDEPENDENT_AMBULATORY_CARE_PROVIDER_SITE_OTHER): Payer: BC Managed Care – PPO | Admitting: Family Medicine

## 2011-06-09 ENCOUNTER — Encounter: Payer: Self-pay | Admitting: Family Medicine

## 2011-06-09 DIAGNOSIS — R1013 Epigastric pain: Secondary | ICD-10-CM

## 2011-06-09 DIAGNOSIS — D72829 Elevated white blood cell count, unspecified: Secondary | ICD-10-CM

## 2011-06-09 DIAGNOSIS — R899 Unspecified abnormal finding in specimens from other organs, systems and tissues: Secondary | ICD-10-CM

## 2011-06-09 DIAGNOSIS — R6889 Other general symptoms and signs: Secondary | ICD-10-CM

## 2011-06-09 DIAGNOSIS — R252 Cramp and spasm: Secondary | ICD-10-CM

## 2011-06-09 DIAGNOSIS — E785 Hyperlipidemia, unspecified: Secondary | ICD-10-CM

## 2011-06-09 DIAGNOSIS — R7309 Other abnormal glucose: Secondary | ICD-10-CM

## 2011-06-09 LAB — CBC
HCT: 43.4 % (ref 36.0–46.0)
MCHC: 33.9 g/dL (ref 30.0–36.0)
MCV: 92.1 fL (ref 78.0–100.0)
RDW: 12.9 % (ref 11.5–15.5)

## 2011-06-09 LAB — HM PAP SMEAR

## 2011-06-09 MED ORDER — ALIGN 4 MG PO CAPS: 1.0000 | ORAL_CAPSULE | ORAL | Status: DC

## 2011-06-09 MED ORDER — SACCHAROMYCES BOULARDII 250 MG PO CAPS: 250.0000 mg | ORAL_CAPSULE | Freq: Two times a day (BID) | ORAL | Status: AC

## 2011-06-09 NOTE — Patient Instructions (Signed)
Irritable Bowel Syndrome You have a problem with your large bowel. This is called irritable bowel syndrome or spastic colon. It can cause cramping in the lower belly. You may also have more rumbling, gurgling, and bloating. Diarrhea and mucus in the stool are common. Constipation with hard small stools is also common. The pain will often recede after a bowel movement. This problem can come and go for months or years. The cause is unknown. A poor diet and emotional stress can add to the problems. There is no test to prove you have irritable bowel. Some tests and x-rays may be needed to be sure you do not have a more serious problem. Treatment of irritable bowel includes:  Diet - Increase fiber and bulk (whole grains, bran, vegetables, fruit) and reduce fats (fried foods, dairy products, meats).   Exercise regularly. Use walking or other exercises to reduce your stress.   Try a psyllium product (Metamucil, Fiberall). One tablespoon in water two times daily may be helpful.   For diarrhea from irritable bowel, avoid food containing fructose and lactose.   Reduce gas and bloating by avoiding milk products, beans, onions, carrots, prunes, apricots, bananas, raisins, pretzels, and bagels.   See your caregiver for follow-up as recommended.  SEEK IMMEDIATE MEDICAL CARE IF YOU HAVE:  Increasing abdominal pain.   Lasting diarrhea.   Severe constipation.   Blood in your stools.   Problems passing your water.   A fever.  Medications to treat spasms, diarrhea, anxiety, or depression may also be beneficial. Your caregiver can help you with this. MAKE SURE YOU:   Understand these instructions.   Will watch your condition.   Will get help right away if you are not doing well or get worse.  Document Released: 12/01/2005 Document Re-Released: 11/13/2008 [facility] Patient Information 2011 [place], [place].  Need 64 oz of clear fluids daily, make sure to drink some right after eating Grape Nuts,  maintain regular exercise  Consider Benefiber 2 tsps twice daily  Start the probiotics and alternate Florastor and Align every other day  Consider cleansing fissure with [employer] Astringent and may coat the area with Desatin Ointment  With Zinc  Start Folic Acid daily 800 to a  daily

## 2011-06-11 NOTE — Progress Notes (Signed)
Jordan Ellison [id number] Apr 21, 1966 06/11/2011      Progress Note-Follow Up  Subjective  Chief Complaint  Chief Complaint  Patient presents with  . Follow-up    from May office visit,discuss test results    HPI  Patient is a 52 we'll Caucasian female who is in today for complaints of tightness in her legs. She reports it is present in both legs left slightly worse than right. She's been having trouble with intermittent cramps in the legs off and on for several years. Recently they've become more frequent and more intense. The pain and pulling sensation she gets is worse with movement. She denies any redness warmth or heat associated with this discomfort. She denies any recent trauma or falls. No recent chest pain, palpitations, shortness of breath. She is complaining of fatigue and does also have a history of some intermittent vertigo and has had some recent episodes of rolling over in bed feeling slightly dizzy but this symptom has been fleeting and not caused any nausea. There's been no headache, congestion or other signs of illness. She is noting some heartburn at times no bloody or tarry stool no vomiting but some occasional mild nausea has been noted here  Past Medical History  Diagnosis Date  . Depression   . Eating disorder   . Fainting spell   . Hyperlipidemia   . History of UTI     Past Surgical History  Procedure Date  . Total abdominal hysterectomy 07/2005    Family History  Problem Relation Age of Onset  . Uterine cancer Mother   . Kidney cancer Mother   . Kidney failure Mother   . Heart attack Father     deceased in 17  . Heart disease Sister   . COPD Sister   . Diabetes Sister   . Thyroid disease Sister   . GER disease Brother   . Hyperlipidemia Brother   . Thyroid disease Brother   . Seizures Brother   . Other      3 sisters/ 7 brothers    History   Social History  . Marital Status: Single    Spouse Name: N/A    Number of Children: N/A  . Years  of Education: N/A   Occupational History  . Not on file.   Social History Main Topics  . Smoking status: Former Games developer  . Smokeless tobacco: Not on file   Comment: quit smoking in 2000  . Alcohol Use: Yes     occasional glass of wine  . Drug Use: Not on file  . Sexually Active: Not on file   Other Topics Concern  . 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 Calcium-Magnesium-Zinc (CAL-MAG-ZINC PO) Take 2 tablets by mouth daily.        Marland Kitchen lidocaine (LINDAMANTLE) 3 % CREA cream Apply topically. Use rectally 3-4 times daily       . omeprazole (PRILOSEC) 40 MG capsule Take 1 capsule (40 mg total) by mouth daily.  30 capsule  3  . polyethylene glycol (MIRALAX / GLYCOLAX) packet Take 17 g by mouth daily.        Marland Kitchen DISCONTD: Probiotic Product (ALIGN PO) Take 1 capsule by mouth daily.  Allergies  Allergen Reactions  . Sulfonamide Derivatives     REACTION: fever, weakness    Review of Systems  Review of Systems  Constitutional: Positive for malaise/fatigue. Negative for fever.  HENT: Negative for congestion.   Eyes: Positive for pain. Negative for discharge.  Respiratory: Negative for shortness of breath.   Cardiovascular: Negative for chest pain, palpitations and leg swelling.  Gastrointestinal: Positive for heartburn, nausea and abdominal pain. Negative for vomiting, diarrhea and blood in stool.  Genitourinary: Negative for dysuria.  Musculoskeletal: Positive for myalgias and joint pain. Negative for falls.       Pain behind b/l knees and calves especially when she walks. No warmth, redness, swelling  Skin: Negative for rash.  Neurological: Negative for loss of  consciousness and headaches.  Endo/Heme/Allergies: Negative for polydipsia.  Psychiatric/Behavioral: Positive for depression. Negative for suicidal ideas. The patient is nervous/anxious. The patient does not have insomnia.     Objective  BP 122/80  Pulse 78  Temp(Src) 97.5 F (36.4 C) (Oral)  Resp 18  Wt 203 lb (92.08 kg)  Physical Exam  Physical Exam  Constitutional: She is oriented to person, place, and time and well-developed, well-nourished, and in no distress. No distress.  HENT:  Head: Normocephalic and atraumatic.  Eyes: Conjunctivae are normal.  Neck: Neck supple. No thyromegaly present.  Cardiovascular: Normal rate, regular rhythm and normal heart sounds.   No murmur heard. Pulmonary/Chest: Effort normal and breath sounds normal. She has no wheezes.  Abdominal: She exhibits no distension and no mass.  Musculoskeletal: She exhibits no edema.  Lymphadenopathy:    She has no cervical adenopathy.  Neurological: She is alert and oriented to person, place, and time.  Skin: Skin is warm and dry. No rash noted. She is not diaphoretic.  Psychiatric: Memory, affect and judgment normal.    Lab Results  Component Value Date   TSH 3.540 04/22/2011   Lab Results  Component Value Date   WBC 10.0 06/09/2011   HGB 14.7 06/09/2011   HCT 43.4 06/09/2011   MCV 92.1 06/09/2011   PLT 271 06/09/2011   Lab Results  Component Value Date   CREATININE 0.69 04/22/2011   BUN 15 04/22/2011   NA 137 04/22/2011   K 4.3 04/22/2011   CL 101 04/22/2011   CO2 24 04/22/2011   Lab Results  Component Value Date   ALT 35 04/22/2011   AST 32 04/22/2011   ALKPHOS 69 04/22/2011   BILITOT 0.7 04/22/2011      Assessment & Plan    LEG CRAMPS Patient with long history of intermittent leg cramps, recently worsened. Encouraged improved hydration, magnesium level checked today and normal. Encouraged her to stay active and stretch. Try Hyland's Night time leg cramp medication.  EPIGASTRIC PAIN Avoid offending  foods, small frequent meals with lean proteins, brown carbs, low fat and minimal spice. Start Ranitidine and check an H Pylori. Report if symptoms do not improve. Encouraged her to start a probiotic and a fiber supplement  HYPERGLYCEMIA Avoid simple carbs and minimize intake in general. Use complex carbs   HYPERLIPIDEMIA  avoid trans fats, encouraged fish oil and fiber supplements

## 2011-06-11 NOTE — Assessment & Plan Note (Signed)
Avoid simple carbs and minimize intake in general. Use complex carbs

## 2011-06-11 NOTE — Assessment & Plan Note (Signed)
Patient with long history of intermittent leg cramps, recently worsened. Encouraged improved hydration, magnesium level checked today and normal. Encouraged her to stay active and stretch. Try Hyland's Night time leg cramp medication.

## 2011-06-11 NOTE — Assessment & Plan Note (Signed)
Avoid offending foods, small frequent meals with lean proteins, brown carbs, low fat and minimal spice. Start Ranitidine and check an H Pylori. Report if symptoms do not improve. Encouraged her to start a probiotic and a fiber supplement

## 2011-06-11 NOTE — Assessment & Plan Note (Addendum)
avoid trans fats, encouraged fish oil and fiber supplements

## 2011-07-09 ENCOUNTER — Encounter: Payer: Self-pay | Admitting: Family Medicine

## 2011-07-09 ENCOUNTER — Ambulatory Visit (INDEPENDENT_AMBULATORY_CARE_PROVIDER_SITE_OTHER): Payer: BC Managed Care – PPO | Admitting: Family Medicine

## 2011-07-09 VITALS — BP 120/80 | HR 98 | Temp 98.1°F | Ht 63.5 in | Wt 198.8 lb

## 2011-07-09 DIAGNOSIS — F411 Generalized anxiety disorder: Secondary | ICD-10-CM

## 2011-07-09 DIAGNOSIS — K5900 Constipation, unspecified: Secondary | ICD-10-CM

## 2011-07-09 DIAGNOSIS — F418 Other specified anxiety disorders: Secondary | ICD-10-CM

## 2011-07-09 DIAGNOSIS — F341 Dysthymic disorder: Secondary | ICD-10-CM

## 2011-07-09 DIAGNOSIS — R1013 Epigastric pain: Secondary | ICD-10-CM

## 2011-07-09 DIAGNOSIS — E785 Hyperlipidemia, unspecified: Secondary | ICD-10-CM

## 2011-07-09 DIAGNOSIS — R7309 Other abnormal glucose: Secondary | ICD-10-CM

## 2011-07-09 MED ORDER — CITALOPRAM HYDROBROMIDE 10 MG PO TABS: 10.0000 mg | ORAL_TABLET | Freq: Every day | ORAL | Status: DC

## 2011-07-09 NOTE — Patient Instructions (Addendum)
Heartburn Heartburn is a painful, burning sensation in the chest. It may feel worse in certain positions, such as lying down or bending over. It is caused by stomach acid backing up into the tube that carries food from the mouth down to the stomach (lower esophagus).  CAUSES A number of conditions can cause or worsen heartburn, including:   Pregnancy.   Being overweight (obesity).   A condition called hiatal hernia, in which part or all of the stomach is moved up into the chest through a weakness in the diaphragm muscle.   Alcohol.   Exercise.   Eating just before going to bed.   Overeating.   Medications, including:   Nonsteroidal anti-inflammatory drugs, such as ibuprofen and naproxen.   Aspirin.   Some blood pressure medicines, including beta-blockers, calcium channel blockers, and alpha-blockers.   Nitrates (used to treat angina).   The asthma medication Theophylline.   Certain sedative drugs.   Heartburn may be worse after eating certain foods. These heartburn-causing foods are different for different people, but may include:   Peppers.   Chocolate.   Coffee.   High-fat foods, including fried foods.   Spicy foods.   Garlic, onions.   Citrus fruits, including oranges, grapefruit, lemons and limes.   Food containing tomatoes or tomato products.   Mint.   Carbonated beverages.   Vinegar.  SYMPTOMS  Symptoms may last for a few minutes or a few hours, and can include:  Burning pain in the chest or lower throat.   Bitter taste in the mouth.   Coughing.  DIAGNOSIS If the usual treatments for heartburn do not improve your symptoms, then tests may be done to see if there is another condition present. Possible tests may include:  X-rays.   Endoscopy. This is when a tube with a light and a camera on the end is used to examine the esophagus and the stomach.   Blood, breath, or stool tests may be used to check for bacteria that cause ulcers.   TREATMENT There are a number of non-prescription medicines used to treat heartburn, including:  Antacids.   Acid reducers (also called H-2 blockers).   Proton-pump inhibitors.  HOME CARE INSTRUCTIONS  Raise the head of your bed by putting blocks under the legs.   Eat 2-3 hours before going to bed.   Stop smoking.   Try to reach and maintain a healthy weight.   Do not eat just a few very large meals. Instead, eat many smaller meals throughout the day.   Try to identify foods and beverages that make your symptoms worse, and avoid these.   Avoid tight clothing.   Do not exercise right after eating.  SEEK IMMEDIATE MEDICAL CARE IF YOU:  Have severe chest pain that goes down your arm, or into your jaw or neck.   Feel sweaty, dizzy, or lightheaded.   Are short of breath.   Throw up (vomit) blood.   Have difficulty or pain with swallowing.   Have bloody or black, tarry stools.   Have bouts of heartburn more than three times a week for more than two weeks.  Document Released: 04/19/2009 Document Re-Released: 02/25/2010 [facility] Patient Information 2011 [place], [place].  Take Metamucil every day Try to take a probiotic every day

## 2011-07-15 ENCOUNTER — Encounter: Payer: Self-pay | Admitting: Family Medicine

## 2011-07-15 NOTE — Assessment & Plan Note (Signed)
Increase hydration add a probiotic cap and a fiber supplement daily, also increase exercise and call if symptoms worsen

## 2011-07-15 NOTE — Assessment & Plan Note (Signed)
Agrees to a trial of citalopram 10 mg daily for ongoing symptoms and reassess at next visit, she has any difficulty with medication

## 2011-07-15 NOTE — Assessment & Plan Note (Signed)
The patient is just establishing with our office and agrees to an FLP prior to next visit. She is to return to that we will discuss more fully at next visit.

## 2011-07-15 NOTE — Assessment & Plan Note (Signed)
Avoid simple carbs and check labs prior to next visit, increase fiber intake

## 2011-07-15 NOTE — Assessment & Plan Note (Signed)
H. pylori testing was negative. She is asked to avoid spicy and fatty foods. She is asked to eat small frequent meals and may try over-the-counter medications to deal with any break through symptoms

## 2011-07-15 NOTE — Progress Notes (Signed)
Jordan Ellison [id number] [birth year] 07/15/2011      Progress Note-Follow Up  Subjective  Chief Complaint  Chief Complaint  Patient presents with  . Follow-up    on medication and diet    HPI  Patient is a 45 year old Caucasian female in today to establish care. She is transferring her care from our practice. She was seen urgently in the past with some epigastric discomfort and dyspepsia. Personally her H. pylori testing was negative. The heartburn and dyspepsia are somewhat better. But not totally resolved. She continues to struggle with anxiety and depression around about finances and recent drop loss. She denies suicidal ideation but is agreeable to starting medications. She continues to have difficulty moving her bowels at times. No bloody or tarry stool but a firm difficult to pass bowel movement every couple of days is noted. No chest pain, palpitations, shortness of breath, recent febrile illness or GU complaints noted. She has had some occasional muscle cramps both night and day but these are infrequent at this time.  Past Medical History  Diagnosis Date  . Depression   . Eating disorder   . Fainting spell   . Hyperlipidemia   . History of UTI     Past Surgical History  Procedure Date  . Total abdominal hysterectomy 07/2005    Family History  Problem Relation Age of Onset  . Uterine cancer Mother   . Kidney failure Mother   . Cancer Mother     kidney, uterine  . Heart disease Mother   . Hypertension Mother   . Hyperlipidemia Mother   . Thyroid disease Mother     likely hyperactive  . Heart attack Father     deceased in 1  . Heart disease Father     MI  . Hyperlipidemia Father   . COPD Sister     smoker  . Other Sister     2 stents  . Heart disease Sister   . Hyperlipidemia Sister   . Obesity Sister   . Hypertension Sister   . Hyperlipidemia Sister   . Obesity Sister   . Thyroid disease Sister     hypothyroid  . Diabetes Sister     type 2- diet  controlled  . GER disease Brother   . Hyperlipidemia Brother   . Hypertension Brother   . Hyperlipidemia Brother   . Iron deficiency Brother   . Other Brother     pigment issue/ poor circulation  . Hyperlipidemia Brother   . Hemochromatosis Brother   . Peripheral vascular disease Brother   . Seizures Brother   . Other Brother     Epilepsy  . Thyroid disease Brother     hyper  . Diabetes Brother     type 2- diet controlled    History   Social History  . Marital Status: Single    Spouse Name: N/A    Number of Children: N/A  . Years of Education: N/A   Occupational History  . Not on file.   Social History Main Topics  . Smoking status: Former Games developer  . Smokeless tobacco: Not on file   Comment: quit smoking in 2000  . Alcohol Use: Yes     occasional glass of wine  . Drug Use: No  . Sexually Active: No   Other Topics Concern  . 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 lidocaine (LINDAMANTLE) 3 % CREA cream Apply topically. Use rectally 3-4 times daily       . omeprazole (PRILOSEC) 40 MG capsule Take 1 capsule (40 mg total) by mouth daily.  30 capsule  3  . polyethylene glycol (MIRALAX / GLYCOLAX) packet Take 17 g by mouth daily.        . Probiotic Product (ALIGN) 4 MG CAPS Take 1 capsule by mouth every other day.  30 capsule  0    Allergies  Allergen Reactions  . Sulfonamide Derivatives     REACTION: fever, weakness    Review of Systems  Review of Systems  Constitutional: Negative for fever and malaise/fatigue.  HENT: Negative for congestion.   Eyes: Negative for discharge.  Respiratory: Negative for shortness of breath.   Cardiovascular: Negative  for chest pain, palpitations and leg swelling.  Gastrointestinal: Positive for heartburn and constipation. Negative for nausea, vomiting, abdominal pain, diarrhea, blood in stool and melena.  Genitourinary: Negative for dysuria.  Musculoskeletal: Negative for falls.  Skin: Negative for rash.  Neurological: Negative for loss of consciousness and headaches.  Endo/Heme/Allergies: Negative for polydipsia.  Psychiatric/Behavioral: Positive for depression. Negative for suicidal ideas. The patient is nervous/anxious. The patient does not have insomnia.     Objective  BP 120/80  Pulse 98  Temp(Src) 98.1 F (36.7 C) (Oral)  Ht 5' 3.5" (1.613 m)  Wt 198 lb 12.8 oz (90.175 kg)  BMI 34.66 kg/m2  SpO2 98%  Physical Exam  Physical Exam  Constitutional: She is oriented to person, place, and time and well-developed, well-nourished, and in no distress. No distress.  HENT:  Head: Normocephalic and atraumatic.  Eyes: Conjunctivae are normal.  Neck: Neck supple. No thyromegaly present.  Cardiovascular: Normal rate, regular rhythm and normal heart sounds.   No murmur heard. Pulmonary/Chest: Effort normal and breath sounds normal. She has no wheezes.  Abdominal: She exhibits no distension and no mass.  Musculoskeletal: She exhibits no edema.  Lymphadenopathy:    She has no cervical adenopathy.  Neurological: She is alert and oriented to person, place, and time.  Skin: Skin is warm and dry. No rash noted. She is not diaphoretic.  Psychiatric: Memory, affect and judgment normal.    Lab Results  Component Value Date   TSH 3.540 04/22/2011   Lab Results  Component Value Date   WBC 10.0 06/09/2011   HGB 14.7 06/09/2011   HCT 43.4 06/09/2011   MCV 92.1 06/09/2011   PLT 271 06/09/2011   Lab Results  Component Value Date   CREATININE 0.69 04/22/2011   BUN 15 04/22/2011   NA 137 04/22/2011   K 4.3 04/22/2011   CL 101 04/22/2011   CO2 24 04/22/2011   Lab Results  Component Value Date   ALT 35 04/22/2011     AST 32 04/22/2011   ALKPHOS 69 04/22/2011   BILITOT 0.7 04/22/2011      Assessment & Plan  HYPERLIPIDEMIA The patient is just establishing with our office and agrees to an FLP prior to next visit. She is to return to that we will discuss more fully at next visit.  ANXIETY Agrees to a trial of citalopram 10 mg daily for ongoing symptoms and reassess at next visit, she has any difficulty with medication  EPIGASTRIC PAIN H. pylori testing was negative. She is asked to avoid  spicy and fatty foods. She is asked to eat small frequent meals and may try over-the-counter medications to deal with any break through symptoms  HYPERGLYCEMIA Avoid simple carbs and check labs prior to next visit, increase fiber intake  CONSTIPATION Increase hydration add a probiotic cap and a fiber supplement daily, also increase exercise and call if symptoms worsen

## 2011-08-20 ENCOUNTER — Other Ambulatory Visit: Payer: Self-pay | Admitting: Internal Medicine

## 2011-08-21 NOTE — Telephone Encounter (Signed)
Pt is due for follow up 09/09/11. Pt last seen by Dr Abner Greenspan. Please call pt to schedule follow up on 09/09/11 and determine which provider pt wants to follow up with.

## 2011-08-22 ENCOUNTER — Other Ambulatory Visit: Payer: Self-pay | Admitting: Internal Medicine

## 2011-08-27 NOTE — Telephone Encounter (Signed)
Left message for patient to return my call.

## 2011-10-04 ENCOUNTER — Other Ambulatory Visit: Payer: Self-pay | Admitting: Family Medicine

## 2011-10-06 ENCOUNTER — Telehealth: Payer: Self-pay | Admitting: Family Medicine

## 2011-10-06 DIAGNOSIS — F418 Other specified anxiety disorders: Secondary | ICD-10-CM

## 2011-10-06 MED ORDER — OMEPRAZOLE 40 MG PO CPDR: 40.0000 mg | DELAYED_RELEASE_CAPSULE | Freq: Every day | ORAL | Status: DC

## 2011-10-06 MED ORDER — CITALOPRAM HYDROBROMIDE 10 MG PO TABS: 10.0000 mg | ORAL_TABLET | Freq: Every day | ORAL | Status: DC

## 2011-10-06 NOTE — Telephone Encounter (Signed)
Pt informed and rx sent to pharmacy 

## 2011-10-06 NOTE — Telephone Encounter (Signed)
The problem with her request is the Citalopram was new. I am willing to refill the Omeprazole with 30 day supply and 3 rf but the Citalopram will require at least one more visit to document response, can give #30 and 1 rf so she has time to figure out how to get back in.

## 2011-10-06 NOTE — Telephone Encounter (Signed)
Left a message for patient to return my call. 

## 2011-10-06 NOTE — Telephone Encounter (Signed)
Patient has not met deductible, can she get refills on Omeprazole & Citalopram w/o an OV?

## 2011-12-02 ENCOUNTER — Other Ambulatory Visit: Payer: Self-pay | Admitting: Family Medicine

## 2011-12-05 ENCOUNTER — Other Ambulatory Visit: Payer: Self-pay | Admitting: Family Medicine

## 2011-12-05 NOTE — Telephone Encounter (Signed)
Pt last seen 07/09/11 and needed follow up on 09/09/11.  No appt is system.  30 day supply sent to pharm.

## 2012-02-02 ENCOUNTER — Other Ambulatory Visit: Payer: Self-pay | Admitting: Family Medicine

## 2012-02-03 NOTE — Telephone Encounter (Signed)
After review of chart Jordan Ellison at [facility] and I are unsure which practice pt desires to seek care from.  I have called pt and left message for her to return my call.

## 2012-02-04 NOTE — Telephone Encounter (Signed)
Pt will continue care with Dr. Abner Greenspan.  CPE scheduled for 3/11.  30 day supply sent.

## 2012-02-07 ENCOUNTER — Other Ambulatory Visit: Payer: Self-pay | Admitting: Family Medicine

## 2012-02-23 ENCOUNTER — Encounter: Payer: Self-pay | Admitting: Family Medicine

## 2012-02-23 ENCOUNTER — Other Ambulatory Visit (HOSPITAL_COMMUNITY)
Admission: RE | Admit: 2012-02-23 | Discharge: 2012-02-23 | Disposition: A | Discharge status: Home / Self Care | Payer: BC Managed Care – PPO | Source: Ambulatory Visit | Attending: Family Medicine | Admitting: Family Medicine

## 2012-02-23 ENCOUNTER — Ambulatory Visit (INDEPENDENT_AMBULATORY_CARE_PROVIDER_SITE_OTHER): Payer: BC Managed Care – PPO | Admitting: Family Medicine

## 2012-02-23 DIAGNOSIS — K602 Anal fissure, unspecified: Secondary | ICD-10-CM

## 2012-02-23 DIAGNOSIS — F341 Dysthymic disorder: Secondary | ICD-10-CM

## 2012-02-23 DIAGNOSIS — K5900 Constipation, unspecified: Secondary | ICD-10-CM

## 2012-02-23 DIAGNOSIS — K219 Gastro-esophageal reflux disease without esophagitis: Secondary | ICD-10-CM

## 2012-02-23 DIAGNOSIS — E785 Hyperlipidemia, unspecified: Secondary | ICD-10-CM

## 2012-02-23 DIAGNOSIS — F329 Major depressive disorder, single episode, unspecified: Secondary | ICD-10-CM

## 2012-02-23 DIAGNOSIS — Z124 Encounter for screening for malignant neoplasm of cervix: Secondary | ICD-10-CM

## 2012-02-23 DIAGNOSIS — Z01419 Encounter for gynecological examination (general) (routine) without abnormal findings: Secondary | ICD-10-CM | POA: Insufficient documentation

## 2012-02-23 HISTORY — DX: Encounter for screening for malignant neoplasm of cervix: Z12.4

## 2012-02-23 MED ORDER — RANITIDINE HCL 150 MG PO TABS: 150.0000 mg | ORAL_TABLET | Freq: Two times a day (BID) | ORAL | Status: DC

## 2012-02-23 MED ORDER — ALPRAZOLAM 0.25 MG PO TABS: 0.2500 mg | ORAL_TABLET | Freq: Two times a day (BID) | ORAL | Status: AC | PRN

## 2012-02-23 MED ORDER — HYDROCORTISONE ACETATE 25 MG RE SUPP: 25.0000 mg | Freq: Two times a day (BID) | RECTAL | Status: AC

## 2012-02-23 MED ORDER — CITALOPRAM HYDROBROMIDE 20 MG PO TABS: 20.0000 mg | ORAL_TABLET | Freq: Every day | ORAL | Status: DC

## 2012-02-23 NOTE — Patient Instructions (Signed)
Preventive Care for Adults, Female A healthy lifestyle and preventive care can promote health and wellness. Preventive health guidelines for women include the following key practices.  A routine yearly physical is a good way to check with your caregiver about your health and preventive screening. It is a chance to share any concerns and updates on your health, and to receive a thorough exam.   Visit your dentist for a routine exam and preventive care every 6 months. Brush your teeth twice a day and floss once a day. Good oral hygiene prevents tooth decay and gum disease.   The frequency of eye exams is based on your age, health, family medical history, use of contact lenses, and other factors. Follow your caregiver's recommendations for frequency of eye exams.   Eat a healthy diet. Foods like vegetables, fruits, whole grains, low-fat dairy products, and lean protein foods contain the nutrients you need without too many calories. Decrease your intake of foods high in solid fats, added sugars, and salt. Eat the right amount of calories for you.Get information about a proper diet from your caregiver, if necessary.   Regular physical exercise is one of the most important things you can do for your health. Most adults should get at least 150 minutes of moderate-intensity exercise (any activity that increases your heart rate and causes you to sweat) each week. In addition, most adults need muscle-strengthening exercises on 2 or more days a week.   Maintain a healthy weight. The body mass index (BMI) is a screening tool to identify possible weight problems. It provides an estimate of body fat based on height and weight. Your caregiver can help determine your BMI, and can help you achieve or maintain a healthy weight.For adults 20 years and older:   A BMI below 18.5 is considered underweight.   A BMI of 18.5 to 24.9 is normal.   A BMI of 25 to 29.9 is considered overweight.   A BMI of 30 and above is  considered obese.   Maintain normal blood lipids and cholesterol levels by exercising and minimizing your intake of saturated fat. Eat a balanced diet with plenty of fruit and vegetables. Blood tests for lipids and cholesterol should begin at age 20 and be repeated every 5 years. If your lipid or cholesterol levels are high, you are over 50, or you are at high risk for heart disease, you may need your cholesterol levels checked more frequently.Ongoing high lipid and cholesterol levels should be treated with medicines if diet and exercise are not effective.   If you smoke, find out from your caregiver how to quit. If you do not use tobacco, do not start.   If you are pregnant, do not drink alcohol. If you are breastfeeding, be very cautious about drinking alcohol. If you are not pregnant and choose to drink alcohol, do not exceed 1 drink per day. One drink is considered to be 12 ounces (355 mL) of beer, 5 ounces (148 mL) of wine, or 1.5 ounces (44 mL) of liquor.   Avoid use of street drugs. Do not share needles with anyone. Ask for help if you need support or instructions about stopping the use of drugs.   High blood pressure causes heart disease and increases the risk of stroke. Your blood pressure should be checked at least every 1 to 2 years. Ongoing high blood pressure should be treated with medicines if weight loss and exercise are not effective.   If you are 55 to 48   years old, ask your caregiver if you should take aspirin to prevent strokes.   Diabetes screening involves taking a blood sample to check your fasting blood sugar level. This should be done once every 3 years, after age 45, if you are within normal weight and without risk factors for diabetes. Testing should be considered at a younger age or be carried out more frequently if you are overweight and have at least 1 risk factor for diabetes.   Breast cancer screening is essential preventive care for women. You should practice "breast  self-awareness." This means understanding the normal appearance and feel of your breasts and may include breast self-examination. Any changes detected, no matter how small, should be reported to a caregiver. Women in their 20s and 30s should have a clinical breast exam (CBE) by a caregiver as part of a regular health exam every 1 to 3 years. After age 40, women should have a CBE every year. Starting at age 40, women should consider having a mammography (breast X-ray test) every year. Women who have a family history of breast cancer should talk to their caregiver about genetic screening. Women at a high risk of breast cancer should talk to their caregivers about having magnetic resonance imaging (MRI) and a mammography every year.   The Pap test is a screening test for cervical cancer. A Pap test can show cell changes on the cervix that might become cervical cancer if left untreated. A Pap test is a procedure in which cells are obtained and examined from the lower end of the uterus (cervix).   Women should have a Pap test starting at age 21.   Between ages 21 and 29, Pap tests should be repeated every 2 years.   Beginning at age 30, you should have a Pap test every 3 years as long as the past 3 Pap tests have been normal.   Some women have medical problems that increase the chance of getting cervical cancer. Talk to your caregiver about these problems. It is especially important to talk to your caregiver if a new problem develops soon after your last Pap test. In these cases, your caregiver may recommend more frequent screening and Pap tests.   The above recommendations are the same for women who have or have not gotten the vaccine for human papillomavirus (HPV).   If you had a hysterectomy for a problem that was not cancer or a condition that could lead to cancer, then you no longer need Pap tests. Even if you no longer need a Pap test, a regular exam is a good idea to make sure no other problems are  starting.   If you are between ages 65 and 70, and you have had normal Pap tests going back 10 years, you no longer need Pap tests. Even if you no longer need a Pap test, a regular exam is a good idea to make sure no other problems are starting.   If you have had past treatment for cervical cancer or a condition that could lead to cancer, you need Pap tests and screening for cancer for at least 20 years after your treatment.   If Pap tests have been discontinued, risk factors (such as a new sexual partner) need to be reassessed to determine if screening should be resumed.   The HPV test is an additional test that may be used for cervical cancer screening. The HPV test looks for the virus that can cause the cell changes on the cervix.   The cells collected during the Pap test can be tested for HPV. The HPV test could be used to screen women aged 30 years and older, and should be used in women of any age who have unclear Pap test results. After the age of 30, women should have HPV testing at the same frequency as a Pap test.   Colorectal cancer can be detected and often prevented. Most routine colorectal cancer screening begins at the age of 50 and continues through age 75. However, your caregiver may recommend screening at an earlier age if you have risk factors for colon cancer. On a yearly basis, your caregiver may provide home test kits to check for hidden blood in the stool. Use of a small camera at the end of a tube, to directly examine the colon (sigmoidoscopy or colonoscopy), can detect the earliest forms of colorectal cancer. Talk to your caregiver about this at age 50, when routine screening begins. Direct examination of the colon should be repeated every 5 to 10 years through age 75, unless early forms of pre-cancerous polyps or small growths are found.   Hepatitis C blood testing is recommended for all people born from 1945 through 1965 and any individual with known risks for hepatitis C.    Practice safe sex. Use condoms and avoid high-risk sexual practices to reduce the spread of sexually transmitted infections (STIs). STIs include gonorrhea, chlamydia, syphilis, trichomonas, herpes, HPV, and human immunodeficiency virus (HIV). Herpes, HIV, and HPV are viral illnesses that have no cure. They can result in disability, cancer, and death. Sexually active women aged 25 and younger should be checked for chlamydia. Older women with new or multiple partners should also be tested for chlamydia. Testing for other STIs is recommended if you are sexually active and at increased risk.   Osteoporosis is a disease in which the bones lose minerals and strength with aging. This can result in serious bone fractures. The risk of osteoporosis can be identified using a bone density scan. Women ages 65 and over and women at risk for fractures or osteoporosis should discuss screening with their caregivers. Ask your caregiver whether you should take a calcium supplement or vitamin D to reduce the rate of osteoporosis.   Menopause can be associated with physical symptoms and risks. Hormone replacement therapy is available to decrease symptoms and risks. You should talk to your caregiver about whether hormone replacement therapy is right for you.   Use sunscreen with sun protection factor (SPF) of 30 or more. Apply sunscreen liberally and repeatedly throughout the day. You should seek shade when your shadow is shorter than you. Protect yourself by wearing long sleeves, pants, a wide-brimmed hat, and sunglasses year round, whenever you are outdoors.   Once a month, do a whole body skin exam, using a mirror to look at the skin on your back. Notify your caregiver of new moles, moles that have irregular borders, moles that are larger than a pencil eraser, or moles that have changed in shape or color.   Stay current with required immunizations.   Influenza. You need a dose every fall (or winter). The composition of  the flu vaccine changes each year, so being vaccinated once is not enough.   Pneumococcal polysaccharide. You need 1 to 2 doses if you smoke cigarettes or if you have certain chronic medical conditions. You need 1 dose at age 65 (or older) if you have never been vaccinated.   Tetanus, diphtheria, pertussis (Tdap, Td). Get 1 dose of   Tdap vaccine if you are younger than age 65, are over 65 and have contact with an infant, are a healthcare worker, are pregnant, or simply want to be protected from whooping cough. After that, you need a Td booster dose every 10 years. Consult your caregiver if you have not had at least 3 tetanus and diphtheria-containing shots sometime in your life or have a deep or dirty wound.   HPV. You need this vaccine if you are a woman age 26 or younger. The vaccine is given in 3 doses over 6 months.   Measles, mumps, rubella (MMR). You need at least 1 dose of MMR if you were born in 1957 or later. You may also need a second dose.   Meningococcal. If you are age 19 to 21 and a first-year college student living in a residence hall, or have one of several medical conditions, you need to get vaccinated against meningococcal disease. You may also need additional booster doses.   Zoster (shingles). If you are age 60 or older, you should get this vaccine.   Varicella (chickenpox). If you have never had chickenpox or you were vaccinated but received only 1 dose, talk to your caregiver to find out if you need this vaccine.   Hepatitis A. You need this vaccine if you have a specific risk factor for hepatitis A virus infection or you simply wish to be protected from this disease. The vaccine is usually given as 2 doses, 6 to 18 months apart.   Hepatitis B. You need this vaccine if you have a specific risk factor for hepatitis B virus infection or you simply wish to be protected from this disease. The vaccine is given in 3 doses, usually over 6 months.  Preventive Services /  Frequency Ages 19 to 39  Blood pressure check.** / Every 1 to 2 years.   Lipid and cholesterol check.** / Every 5 years beginning at age 20.   Clinical breast exam.** / Every 3 years for women in their 20s and 30s.   Pap test.** / Every 2 years from ages 21 through 29. Every 3 years starting at age 30 through age 65 or 70 with a history of 3 consecutive normal Pap tests.   HPV screening.** / Every 3 years from ages 30 through ages 65 to 70 with a history of 3 consecutive normal Pap tests.   Hepatitis C blood test.** / For any individual with known risks for hepatitis C.   Skin self-exam. / Monthly.   Influenza immunization.** / Every year.   Pneumococcal polysaccharide immunization.** / 1 to 2 doses if you smoke cigarettes or if you have certain chronic medical conditions.   Tetanus, diphtheria, pertussis (Tdap, Td) immunization. / A one-time dose of Tdap vaccine. After that, you need a Td booster dose every 10 years.   HPV immunization. / 3 doses over 6 months, if you are 26 and younger.   Measles, mumps, rubella (MMR) immunization. / You need at least 1 dose of MMR if you were born in 1957 or later. You may also need a second dose.   Meningococcal immunization. / 1 dose if you are age 19 to 21 and a first-year college student living in a residence hall, or have one of several medical conditions, you need to get vaccinated against meningococcal disease. You may also need additional booster doses.   Varicella immunization.** / Consult your caregiver.   Hepatitis A immunization.** / Consult your caregiver. 2 doses, 6 to 18 months   apart.   Hepatitis B immunization.** / Consult your caregiver. 3 doses usually over 6 months.  Ages 40 to 64  Blood pressure check.** / Every 1 to 2 years.   Lipid and cholesterol check.** / Every 5 years beginning at age 20.   Clinical breast exam.** / Every year after age 40.   Mammogram.** / Every year beginning at age 40 and continuing for as  long as you are in good health. Consult with your caregiver.   Pap test.** / Every 3 years starting at age 30 through age 65 or 70 with a history of 3 consecutive normal Pap tests.   HPV screening.** / Every 3 years from ages 30 through ages 65 to 70 with a history of 3 consecutive normal Pap tests.   Fecal occult blood test (FOBT) of stool. / Every year beginning at age 50 and continuing until age 75. You may not need to do this test if you get a colonoscopy every 10 years.   Flexible sigmoidoscopy or colonoscopy.** / Every 5 years for a flexible sigmoidoscopy or every 10 years for a colonoscopy beginning at age 50 and continuing until age 75.   Hepatitis C blood test.** / For all people born from 1945 through 1965 and any individual with known risks for hepatitis C.   Skin self-exam. / Monthly.   Influenza immunization.** / Every year.   Pneumococcal polysaccharide immunization.** / 1 to 2 doses if you smoke cigarettes or if you have certain chronic medical conditions.   Tetanus, diphtheria, pertussis (Tdap, Td) immunization.** / A one-time dose of Tdap vaccine. After that, you need a Td booster dose every 10 years.   Measles, mumps, rubella (MMR) immunization. / You need at least 1 dose of MMR if you were born in 1957 or later. You may also need a second dose.   Varicella immunization.** / Consult your caregiver.   Meningococcal immunization.** / Consult your caregiver.   Hepatitis A immunization.** / Consult your caregiver. 2 doses, 6 to 18 months apart.   Hepatitis B immunization.** / Consult your caregiver. 3 doses, usually over 6 months.  Ages 65 and over  Blood pressure check.** / Every 1 to 2 years.   Lipid and cholesterol check.** / Every 5 years beginning at age 20.   Clinical breast exam.** / Every year after age 40.   Mammogram.** / Every year beginning at age 40 and continuing for as long as you are in good health. Consult with your caregiver.   Pap test.** /  Every 3 years starting at age 30 through age 65 or 70 with a 3 consecutive normal Pap tests. Testing can be stopped between 65 and 70 with 3 consecutive normal Pap tests and no abnormal Pap or HPV tests in the past 10 years.   HPV screening.** / Every 3 years from ages 30 through ages 65 or 70 with a history of 3 consecutive normal Pap tests. Testing can be stopped between 65 and 70 with 3 consecutive normal Pap tests and no abnormal Pap or HPV tests in the past 10 years.   Fecal occult blood test (FOBT) of stool. / Every year beginning at age 50 and continuing until age 75. You may not need to do this test if you get a colonoscopy every 10 years.   Flexible sigmoidoscopy or colonoscopy.** / Every 5 years for a flexible sigmoidoscopy or every 10 years for a colonoscopy beginning at age 50 and continuing until age 75.   Hepatitis   C blood test.** / For all people born from [birth year] through 1965 and any individual with known risks for hepatitis C.   Osteoporosis screening.** / A one-time screening for women ages 31 and over and women at risk for fractures or osteoporosis.   Skin self-exam. / Monthly.   Influenza immunization.** / Every year.   Pneumococcal polysaccharide immunization.** / 1 dose at age 98 (or older) if you have never been vaccinated.   Tetanus, diphtheria, pertussis (Tdap, Td) immunization. / A one-time dose of Tdap vaccine if you are over 65 and have contact with an infant, are a Research scientist (physical sciences), or simply want to be protected from whooping cough. After that, you need a Td booster dose every 10 years.   Varicella immunization.** / Consult your caregiver.   Meningococcal immunization.** / Consult your caregiver.   Hepatitis A immunization.** / Consult your caregiver. 2 doses, 6 to 18 months apart.   Hepatitis B immunization.** / Check with your caregiver. 3 doses, usually over 6 months.  ** Family history and personal history of risk and conditions may change your caregiver's  recommendations. Document Released: 01/27/2002 Document Revised: 11/20/2011 Document Reviewed: 04/28/2011 [facility] Patient Information 2012 [place], [place]. Labs prior to next visit  Cbc, cmp, tsh, lipids

## 2012-02-23 NOTE — Assessment & Plan Note (Signed)
Had been doing better when she was walking, eating better, hi fiber and increased fluids

## 2012-02-23 NOTE — Progress Notes (Signed)
Patient ID: Jordan Ellison, female   DOB: 06/02/1966, 46 y.o.   MRN: [id number] Jordan Ellison [id number] 10/31/1966 02/24/2012      Progress Note New Patient  Subjective  Chief Complaint  Chief Complaint  Patient presents with  . Annual Exam    Physical  . Gynecologic Exam    pap    HPI  Patient is in today for followup and GYN exam. After her last visit she had changed her diet and is exercising. He felt much better unfortunately over the last month or so her husband has gotten ill and been in the  Hospital. She stopped exercising and started eating poorly again. While she was eating her reflux is better her fatigue was better her depression was better. Now she is feeling fatigued and frustrated. He is having more trouble with reflux and constipation again. 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 Previously has been treated for them and does not want to be treated today secondary to her bad reaction to her cream at the expense. She notes that her reflux worsens ranitidine has been helpful in  Past Medical History  Diagnosis Date  . Depression   . Eating disorder   . Fainting spell   . Hyperlipidemia   . History of UTI   . Cervical cancer screening 02/23/2012    Past Surgical History  Procedure Date  . Total abdominal hysterectomy 07/2005    Family History  Problem Relation Age of Onset  . Uterine cancer Mother   . Kidney failure Mother   . Cancer Mother     kidney, uterine  . Heart disease Mother   . Hypertension Mother   . Hyperlipidemia Mother   . Thyroid disease Mother     likely hyperactive  . Heart attack Father     deceased in 43  . Heart disease Father     MI  . Hyperlipidemia Father   . COPD Sister     smoker  . Other Sister     2 stents  . Heart disease Sister   . Hyperlipidemia Sister   . Obesity Sister   . Hypertension Sister   . Hyperlipidemia Sister   . Obesity Sister   . Thyroid disease Sister     hypothyroid  .  Diabetes Sister     type 2- diet controlled  . GER disease Brother   . Hyperlipidemia Brother   . Hypertension Brother   . Hyperlipidemia Brother   . Iron deficiency Brother   . Other Brother     pigment issue/ poor circulation  . Hyperlipidemia Brother   . Hemochromatosis Brother   . Peripheral vascular disease Brother   . Seizures Brother   . Other Brother     Epilepsy  . Thyroid disease Brother     hyper  . Diabetes Brother     type 2- diet controlled    History   Social History  . Marital Status: Single    Spouse Name: N/A    Number of Children: N/A  . Years of Education: N/A   Occupational History  . Not on file.   Social History Main Topics  . Smoking status: Former Games developer  . Smokeless tobacco: Not on file   Comment: quit smoking in 2000  . Alcohol Use: Yes     occasional glass of wine  . Drug Use: No  . Sexually Active: No   Other Topics Concern  . 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 FOLIC ACID PO Take 800 mg by mouth daily.       Marland Kitchen MAGNESIUM PO Take 400 mg by mouth 2 (two) times daily.       . multivitamin (THERAGRAN) per tablet Take 1 tablet by mouth daily.        Marland Kitchen omeprazole (PRILOSEC) 40 MG capsule TAKE 1 CAPSULE (40 MG TOTAL) BY MOUTH DAILY.  30 capsule  0  . polyethylene glycol (MIRALAX / GLYCOLAX) packet Take 17 g by mouth daily.        Marland Kitchen lidocaine (LINDAMANTLE) 3 % CREA cream Apply topically. Use rectally 3-4 times daily         Allergies  Allergen Reactions  . Sulfonamide Derivatives     REACTION: fever, weakness    Review of Systems  Review of Systems  Constitutional: Positive for malaise/fatigue. Negative for fever and  chills.  HENT: Negative for hearing loss, nosebleeds and congestion.   Eyes: Negative for discharge.  Respiratory: Negative for cough, sputum production, shortness of breath and wheezing.   Cardiovascular: Negative for chest pain, palpitations and leg swelling.  Gastrointestinal: Positive for abdominal pain, constipation and blood in stool. Negative for heartburn, nausea, vomiting, diarrhea and melena.  Genitourinary: Negative for dysuria, urgency, frequency and hematuria.  Musculoskeletal: Negative for myalgias, back pain and falls.  Skin: Negative for rash.  Neurological: Negative for dizziness, tremors, sensory change, focal weakness, loss of consciousness, weakness and headaches.  Endo/Heme/Allergies: Negative for polydipsia. Does not bruise/bleed easily.  Psychiatric/Behavioral: Negative for depression and suicidal ideas. The patient is nervous/anxious. The patient does not have insomnia.     Objective  BP 101/65  Pulse 70  Temp(Src) 98.2 F (36.8 C) (Temporal)  Ht 5' 3.5" (1.613 m)  Wt 222 lb 1.9 oz (100.753 kg)  BMI 38.73 kg/m2  SpO2 96%  Physical Exam  Physical Exam  Constitutional: She is oriented to person, place, and time and well-developed, well-nourished, and in no distress. No distress.  HENT:  Head: Normocephalic and atraumatic.  Right Ear: External ear normal.  Left Ear: External ear normal.  Nose: Nose normal.  Mouth/Throat: Oropharynx is clear and moist. No oropharyngeal exudate.  Eyes: Conjunctivae are normal. Pupils are equal, round, and reactive to light. Right eye exhibits no discharge. Left eye exhibits no discharge. No scleral icterus.  Neck: Normal range of motion. Neck supple. No thyromegaly present.  Cardiovascular: Normal rate, regular rhythm, normal heart sounds and intact distal pulses.   No murmur heard. Pulmonary/Chest: Effort normal and breath sounds normal. No respiratory distress. She has no wheezes. She has no rales.  Abdominal: Soft. Bowel  sounds are normal. She exhibits no distension and no mass. There is no tenderness.  Genitourinary: Vagina normal, right adnexa normal and left adnexa normal. No vaginal discharge found.       Verrucous lesions around anus. None noted in vaginal region  Musculoskeletal: Normal range of motion. She exhibits no edema and no tenderness.  Lymphadenopathy:    She has no cervical adenopathy.  Neurological: She is alert and oriented to person, place, and time. She has normal reflexes. No cranial nerve deficit. Coordination normal.  Skin: Skin is warm and dry. No rash noted. She is  not diaphoretic.  Psychiatric: Mood, memory and affect normal.       Assessment & Plan  CONSTIPATION Had been doing better when she was walking, eating better, hi fiber and increased fluids  Cervical cancer screening Pap taken today anal warts seen, patient declined treatment today  RECTAL FISSURE Was doing much better when she was eating well and exercising but has had more trouble again recently. Encouraged to increase her exercise her fluids as well as fiber. Given AnusolHC suppositories to use prn  HYPERLIPIDEMIA Avoid trans fats, increase exercise attempt weight loss and start Megared daily

## 2012-02-24 ENCOUNTER — Encounter: Payer: Self-pay | Admitting: Family Medicine

## 2012-02-24 NOTE — Assessment & Plan Note (Signed)
Pap taken today anal warts seen, patient declined treatment today

## 2012-02-24 NOTE — Assessment & Plan Note (Signed)
Avoid trans fats, increase exercise attempt weight loss and start Megared daily

## 2012-02-24 NOTE — Assessment & Plan Note (Signed)
Was doing much better when she was eating well and exercising but has had more trouble again recently. Encouraged to increase her exercise her fluids as well as fiber. Given AnusolHC suppositories to use prn

## 2012-03-10 ENCOUNTER — Other Ambulatory Visit: Payer: Self-pay | Admitting: Family Medicine

## 2012-04-12 ENCOUNTER — Other Ambulatory Visit: Payer: Self-pay | Admitting: Family Medicine

## 2012-04-12 DIAGNOSIS — K625 Hemorrhage of anus and rectum: Secondary | ICD-10-CM

## 2012-04-12 DIAGNOSIS — E785 Hyperlipidemia, unspecified: Secondary | ICD-10-CM

## 2012-04-13 LAB — LIPID PANEL
HDL: 41 mg/dL (ref >39)
LDL Cholesterol: 160 mg/dL — ABNORMAL HIGH (ref 0–99)
Total CHOL/HDL Ratio: 5.5 Ratio
VLDL: 26 mg/dL (ref 0–40)

## 2012-04-13 LAB — TSH: TSH: 3.894 u[IU]/mL (ref 0.350–4.500)

## 2012-04-13 LAB — CBC
Hemoglobin: 13.9 g/dL (ref 12.0–15.0)
MCH: 31.6 pg (ref 26.0–34.0)
Platelets: 277 10*3/uL (ref 150–400)
RBC: 4.4 MIL/uL (ref 3.87–5.11)
WBC: 7.9 10*3/uL (ref 4.0–10.5)

## 2012-04-13 LAB — BASIC METABOLIC PANEL
CO2: 27 mEq/L (ref 19–32)
Calcium: 9.4 mg/dL (ref 8.4–10.5)
Chloride: 106 mEq/L (ref 96–112)
Glucose, Bld: 100 mg/dL — ABNORMAL HIGH (ref 70–99)
Sodium: 141 mEq/L (ref 135–145)

## 2012-04-13 LAB — PHOSPHORUS: Phosphorus: 3.3 mg/dL (ref 2.3–4.6)

## 2012-04-13 LAB — HEPATIC FUNCTION PANEL
Albumin: 4.4 g/dL (ref 3.5–5.2)
Bilirubin, Direct: 0.2 mg/dL (ref 0.0–0.3)
Total Bilirubin: 1 mg/dL (ref 0.3–1.2)

## 2012-04-15 ENCOUNTER — Ambulatory Visit (INDEPENDENT_AMBULATORY_CARE_PROVIDER_SITE_OTHER): Payer: BC Managed Care – PPO | Admitting: Family Medicine

## 2012-04-15 ENCOUNTER — Encounter: Payer: Self-pay | Admitting: Family Medicine

## 2012-04-15 VITALS — BP 106/74 | HR 76 | Temp 97.2°F | Ht 63.5 in | Wt 222.1 lb

## 2012-04-15 DIAGNOSIS — G4700 Insomnia, unspecified: Secondary | ICD-10-CM | POA: Insufficient documentation

## 2012-04-15 DIAGNOSIS — F419 Anxiety disorder, unspecified: Secondary | ICD-10-CM

## 2012-04-15 DIAGNOSIS — F341 Dysthymic disorder: Secondary | ICD-10-CM

## 2012-04-15 DIAGNOSIS — E785 Hyperlipidemia, unspecified: Secondary | ICD-10-CM

## 2012-04-15 HISTORY — DX: Insomnia, unspecified: G47.00

## 2012-04-15 MED ORDER — ALPRAZOLAM 0.25 MG PO TABS: 0.2500 mg | ORAL_TABLET | Freq: Two times a day (BID) | ORAL | Status: DC | PRN

## 2012-04-15 NOTE — Assessment & Plan Note (Signed)
Avoid trans fats, restart Krill oil and continue to monitor

## 2012-04-15 NOTE — Progress Notes (Signed)
Patient ID: Jordan Ellison, female   DOB: 10/13/1966, 46 y.o.   MRN: [id number] RHEGAN TRUNNELL [id number] 04/10/1966 04/15/2012      Progress Note-Follow Up  Subjective  Chief Complaint  Chief Complaint  Patient presents with  . Follow-up    57month     HPI  Patient is a 9 -year-old female in today for followup. She has recently lost her father and is very tearful today. He is undergoing grief counseling with hospice. Her dad did have dementia. She acknowledges her anxiety and depression are worse since this happened. She continues to follow and care for her mother with dementia and her special needs brother. No recent fevers, chills, chest pain, palpitations, shortness of breath, GI or GU complaints. Her constipation is somewhat improved. She continues to have trouble sleeping. She has trouble falling asleep and staying asleep. Continues to struggle with low mood but does not acknowledge any suicidal ideation. He is easily tearful for  Past Medical History  Diagnosis Date  . Depression   . Eating disorder   . Fainting spell   . Hyperlipidemia   . History of UTI   . Cervical cancer screening 02/23/2012  . Anxiety and depression 10/29/2010    Qualifier: Diagnosis of  By: Peggyann Juba FNP, Katrinka Blazing   . Insomnia 04/15/2012    Past Surgical History  Procedure Date  . Total abdominal hysterectomy 07/2005    Family History  Problem Relation Age of Onset  . Uterine cancer Mother   . Kidney failure Mother   . Cancer Mother     kidney, uterine  . Heart disease Mother   . Hypertension Mother   . Hyperlipidemia Mother   . Thyroid disease Mother     likely hyperactive  . Heart attack Father     deceased in 59  . Heart disease Father     MI  . Hyperlipidemia Father   . COPD Sister     smoker  . Other Sister     2 stents  . Heart disease Sister   . Hyperlipidemia Sister   . Obesity Sister   . Hypertension Sister   . Hyperlipidemia Sister   . Obesity Sister   . Thyroid  disease Sister     hypothyroid  . Diabetes Sister     type 2- diet controlled  . GER disease Brother   . Hyperlipidemia Brother   . Hypertension Brother   . Hyperlipidemia Brother   . Iron deficiency Brother   . Other Brother     pigment issue/ poor circulation  . Hyperlipidemia Brother   . Hemochromatosis Brother   . Peripheral vascular disease Brother   . Seizures Brother   . Other Brother     Epilepsy  . Thyroid disease Brother     hyper  . Diabetes Brother     type 2- diet controlled    History   Social History  . Marital Status: Single    Spouse Name: N/A    Number of Children: N/A  . Years of Education: N/A   Occupational History  . Not on file.   Social History Main Topics  . Smoking status: Former Games developer  . Smokeless tobacco: Not on file   Comment: quit smoking in 2000  . Alcohol Use: Yes     occasional glass of wine  . Drug Use: No  . Sexually Active: No   Other Topics Concern  . 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 citalopram (CELEXA) 20 MG tablet Take 1 tablet (20 mg total) by mouth daily.  30 tablet  3  . FOLIC ACID PO Take 800 mg by mouth daily.       Marland Kitchen MAGNESIUM PO Take 400 mg by mouth 2 (two) times daily.       . multivitamin (THERAGRAN) per tablet Take 1 tablet by mouth daily.        Marland Kitchen omeprazole (PRILOSEC) 40 MG capsule TAKE 1 CAPSULE (40 MG TOTAL) BY MOUTH DAILY.  30 capsule  1  . polyethylene glycol (MIRALAX / GLYCOLAX) packet Take 17 g by mouth daily.        . ranitidine (ZANTAC) 150 MG tablet Take 1 tablet (150 mg total) by mouth 2 (two) times daily.  60 tablet  5  . NON FORMULARY 1 tablet daily. Gymnema Sylvestre         Allergies  Allergen Reactions  . Sulfonamide Derivatives     REACTION: fever, weakness    Review of Systems  Review of Systems  Constitutional: Positive for malaise/fatigue. Negative for fever.  HENT: Negative for congestion.   Eyes: Negative for discharge.  Respiratory: Negative for shortness of breath.   Cardiovascular: Negative for chest pain, palpitations and leg swelling.  Gastrointestinal: Negative for nausea, abdominal pain and diarrhea.  Genitourinary: Negative for dysuria.  Musculoskeletal: Negative for falls.  Skin: Negative for rash.  Neurological: Negative for loss of consciousness and headaches.  Endo/Heme/Allergies: Negative for polydipsia.  Psychiatric/Behavioral: Positive for depression. Negative for suicidal ideas. The patient is nervous/anxious and has insomnia.     Objective  BP 106/74  Pulse 76  Temp(Src) 97.2 F (36.2 C) (Temporal)  Ht 5' 3.5" (1.613 m)  Wt 222 lb 1.9 oz (100.753 kg)  BMI 38.73 kg/m2  SpO2 94%  Physical Exam  Physical Exam  Constitutional: She is oriented to person, place, and time and well-developed, well-nourished, and in no distress. No distress.  HENT:  Head: Normocephalic and atraumatic.  Eyes: Conjunctivae are normal.  Neck: Neck supple. No thyromegaly present.  Cardiovascular: Normal rate, regular rhythm and normal heart sounds.   No murmur heard. Pulmonary/Chest: Effort normal and breath sounds normal. She has no wheezes.  Abdominal: She exhibits no distension and no mass.  Musculoskeletal: She exhibits no edema.  Lymphadenopathy:    She has no cervical adenopathy.  Neurological: She is alert and oriented to person, place, and time.  Skin: Skin is warm and dry. No rash noted. She is not diaphoretic.  Psychiatric: Memory, affect and judgment normal.    Lab Results  Component Value Date   TSH 3.894 04/12/2012   Lab Results  Component Value Date   WBC 7.9 04/12/2012   HGB 13.9 04/12/2012   HCT 42.1 04/12/2012    MCV 95.7 04/12/2012   PLT 277 04/12/2012   Lab Results  Component Value Date   CREATININE 0.65 04/12/2012   BUN 9 04/12/2012   NA 141 04/12/2012   K 4.7 04/12/2012   CL 106 04/12/2012   CO2 27 04/12/2012   Lab Results  Component Value Date   ALT 35 04/12/2012   AST 27 04/12/2012   ALKPHOS 72 04/12/2012   BILITOT 1.0 04/12/2012   Lab Results  Component Value Date   CHOL 227* 04/12/2012   Lab Results  Component Value Date   HDL 41 04/12/2012   Lab Results  Component Value Date   LDLCALC 160* 04/12/2012   Lab Results  Component Value Date   TRIG 130 04/12/2012   Lab Results  Component Value Date   CHOLHDL 5.5 04/12/2012     Assessment & Plan  Anxiety and depression Patient very sad today secondary to her Dad has died recently. She continues to care for her elderly mother with dementia and her brother with special needs. Will increase Citalopram and allow Alprazolam prn. Continue to follow with Hospice for counseling.   Insomnia Alprazolam prn   HYPERLIPIDEMIA Avoid trans fats, restart Krill oil and continue to monitor

## 2012-04-15 NOTE — Assessment & Plan Note (Signed)
Alprazolam prn 

## 2012-04-15 NOTE — Patient Instructions (Signed)

## 2012-04-15 NOTE — Assessment & Plan Note (Signed)
Patient very sad today secondary to her Dad has died recently. She continues to care for her elderly mother with dementia and her brother with special needs. Will increase Citalopram and allow Alprazolam prn. Continue to follow with Hospice for counseling.

## 2012-04-15 NOTE — Progress Notes (Signed)
   Patient ID: Jordan Ellison, female   DOB: 06-22-1966, 46 y.o.   MRN: [id number]   Information about father dying was entered in wrong chart. She is on citalopram for anxiety/depression but it is working well and no changes were made

## 2012-04-26 ENCOUNTER — Other Ambulatory Visit: Payer: Self-pay

## 2012-04-26 DIAGNOSIS — F329 Major depressive disorder, single episode, unspecified: Secondary | ICD-10-CM

## 2012-04-26 MED ORDER — OMEPRAZOLE 40 MG PO CPDR: 40.0000 mg | DELAYED_RELEASE_CAPSULE | Freq: Every day | ORAL | Status: DC

## 2012-04-26 MED ORDER — CITALOPRAM HYDROBROMIDE 20 MG PO TABS: 20.0000 mg | ORAL_TABLET | Freq: Every day | ORAL | Status: DC

## 2012-05-11 ENCOUNTER — Other Ambulatory Visit: Payer: Self-pay

## 2012-05-11 DIAGNOSIS — F329 Major depressive disorder, single episode, unspecified: Secondary | ICD-10-CM

## 2012-05-11 MED ORDER — CITALOPRAM HYDROBROMIDE 20 MG PO TABS: 20.0000 mg | ORAL_TABLET | Freq: Every day | ORAL | Status: DC

## 2012-05-11 MED ORDER — OMEPRAZOLE 40 MG PO CPDR: 40.0000 mg | DELAYED_RELEASE_CAPSULE | Freq: Every day | ORAL | Status: DC

## 2012-08-17 ENCOUNTER — Ambulatory Visit: Payer: BC Managed Care – PPO | Admitting: Family Medicine

## 2013-03-09 ENCOUNTER — Other Ambulatory Visit: Payer: Self-pay

## 2013-03-09 MED ORDER — OMEPRAZOLE 40 MG PO CPDR: 40.0000 mg | DELAYED_RELEASE_CAPSULE | Freq: Every day | ORAL | Status: DC

## 2013-07-05 ENCOUNTER — Ambulatory Visit (INDEPENDENT_AMBULATORY_CARE_PROVIDER_SITE_OTHER): Payer: BC Managed Care – PPO | Admitting: Family Medicine

## 2013-07-05 ENCOUNTER — Encounter: Payer: Self-pay | Admitting: Family Medicine

## 2013-07-05 VITALS — BP 98/82 | HR 80 | Temp 98.5°F | Ht 63.5 in | Wt 231.1 lb

## 2013-07-05 DIAGNOSIS — F341 Dysthymic disorder: Secondary | ICD-10-CM

## 2013-07-05 DIAGNOSIS — R5381 Other malaise: Secondary | ICD-10-CM

## 2013-07-05 DIAGNOSIS — F411 Generalized anxiety disorder: Secondary | ICD-10-CM

## 2013-07-05 DIAGNOSIS — R5382 Chronic fatigue, unspecified: Secondary | ICD-10-CM

## 2013-07-05 DIAGNOSIS — K219 Gastro-esophageal reflux disease without esophagitis: Secondary | ICD-10-CM

## 2013-07-05 DIAGNOSIS — A630 Anogenital (venereal) warts: Secondary | ICD-10-CM

## 2013-07-05 DIAGNOSIS — F419 Anxiety disorder, unspecified: Secondary | ICD-10-CM

## 2013-07-05 DIAGNOSIS — R140 Abdominal distension (gaseous): Secondary | ICD-10-CM

## 2013-07-05 DIAGNOSIS — R142 Eructation: Secondary | ICD-10-CM

## 2013-07-05 MED ORDER — BUSPIRONE HCL 10 MG PO TABS: 10.0000 mg | ORAL_TABLET | Freq: Two times a day (BID) | ORAL | Status: DC | PRN

## 2013-07-05 MED ORDER — OMEPRAZOLE 40 MG PO CPDR: 40.0000 mg | DELAYED_RELEASE_CAPSULE | Freq: Every day | ORAL | Status: DC

## 2013-07-05 MED ORDER — RANITIDINE HCL 150 MG PO TABS: 150.0000 mg | ORAL_TABLET | Freq: Two times a day (BID) | ORAL | Status: DC | PRN

## 2013-07-05 NOTE — Patient Instructions (Signed)
Next visit annual exam with labs prior lipid, renal, tsh, hepatic, cbc

## 2013-07-06 ENCOUNTER — Encounter: Payer: Self-pay | Admitting: Family Medicine

## 2013-07-06 DIAGNOSIS — A630 Anogenital (venereal) warts: Secondary | ICD-10-CM

## 2013-07-06 HISTORY — DX: Anogenital (venereal) warts: A63.0

## 2013-07-06 NOTE — Assessment & Plan Note (Signed)
Referred to gyn for treatment

## 2013-07-06 NOTE — Assessment & Plan Note (Addendum)
Encouraged bland diet, with small frequent meals. Start a probiotic and a fiber supplement, avoid fatty and spicy foods. Tums qhs and Ranitidine and Omeprazole daily

## 2013-07-06 NOTE — Progress Notes (Signed)
Patient ID: Jordan Ellison, female   DOB: 1966-08-21, 47 y.o.   MRN: [id number] Jordan Ellison [id number] 28-Oct-1966 07/06/2013      Progress Note-Follow Up  Subjective  Chief Complaint  Chief Complaint  Patient presents with  . knot on upper stomach  . Dizziness  . Nausea    HPI  Patient is a 47 year old Caucasian female who is in today for evaluation of epigastric pain. She is very salty fatty meal yesterday and has been having bloating in her epigastrium with some nausea and vomiting since. She also cervical and lightheaded and dehydrated. She's not been eating and drinking well. She denies fevers chills. She denies significant diarrhea or constipation. No chest pain or palpitations. No shortness of breath. She has taken a new job and finds it very stressful. She now just has trouble relaxing at this time  Past Medical History  Diagnosis Date  . Depression   . Eating disorder   . Fainting spell   . Hyperlipidemia   . History of UTI   . Cervical cancer screening 02/23/2012  . Anxiety and depression 10/29/2010    Qualifier: Diagnosis of  By: Peggyann Juba FNP, Katrinka Blazing   . Insomnia 04/15/2012  . Genital warts 07/06/2013    Past Surgical History  Procedure Laterality Date  . Total abdominal hysterectomy  07/2005    Family History  Problem Relation Age of Onset  . Uterine cancer Mother   . Kidney failure Mother   . Cancer Mother     kidney, uterine  . Heart disease Mother   . Hypertension Mother   . Hyperlipidemia Mother   . Thyroid disease Mother     likely hyperactive  . Heart attack Father     deceased in 22  . Heart disease Father     MI  . Hyperlipidemia Father   . COPD Sister     smoker  . Other Sister     2 stents  . Heart disease Sister   . Hyperlipidemia Sister   . Obesity Sister   . Hypertension Sister   . Hyperlipidemia Sister   . Obesity Sister   . Thyroid disease Sister     hypothyroid  . Diabetes Sister     type 2- diet controlled  . GER  disease Brother   . Hyperlipidemia Brother   . Hypertension Brother   . Hyperlipidemia Brother   . Iron deficiency Brother   . Other Brother     pigment issue/ poor circulation  . Hyperlipidemia Brother   . Hemochromatosis Brother   . Peripheral vascular disease Brother   . Seizures Brother   . Other Brother     Epilepsy  . Thyroid disease Brother     hyper  . Diabetes Brother     type 2- diet controlled    History   Social History  . Marital Status: Single    Spouse Name: N/A    Number of Children: N/A  . Years of Education: N/A   Occupational History  . Not on file.   Social History Main Topics  . Smoking status: Former Games developer  . Smokeless tobacco: Not on file     Comment: quit smoking in 2000  . Alcohol Use: Yes     Comment: occasional glass of wine  . Drug Use: No  . Sexually Active: No   Other Topics Concern  . 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 FOLIC ACID PO Take 800 mg by mouth daily.       Marland Kitchen MAGNESIUM PO Take 400 mg by mouth 2 (two) times daily.       . Omega-3 Fatty Acids (FISH OIL) 1360 MG CAPS Take 1 capsule by mouth daily.      . polyethylene glycol (MIRALAX / GLYCOLAX) packet Take 17 g by mouth daily.        . citalopram (CELEXA) 20 MG tablet Take 1 tablet (20 mg total) by mouth daily.  30 tablet  3  . ranitidine (ZANTAC) 150 MG tablet Take 1 tablet (150 mg total) by mouth 2 (two) times daily.  60 tablet  5   No current facility-administered medications on file prior to visit.    Allergies  Allergen Reactions  . Sulfonamide Derivatives     REACTION: fever, weakness    Review of  Systems  Review of Systems  Constitutional: Negative for fever and malaise/fatigue.  HENT: Negative for congestion.   Eyes: Negative for pain and discharge.  Respiratory: Negative for shortness of breath.   Cardiovascular: Negative for chest pain, palpitations and leg swelling.  Gastrointestinal: Negative for nausea, abdominal pain and diarrhea.  Genitourinary: Negative for dysuria.  Musculoskeletal: Negative for falls.  Skin: Negative for rash.  Neurological: Negative for loss of consciousness and headaches.  Endo/Heme/Allergies: Negative for polydipsia.  Psychiatric/Behavioral: Negative for depression and suicidal ideas. The patient is not nervous/anxious and does not have insomnia.     Objective  BP 98/82  Pulse 80  Temp(Src) 98.5 F (36.9 C) (Oral)  Ht 5' 3.5" (1.613 m)  Wt 231 lb 1.3 oz (104.817 kg)  BMI 40.29 kg/m2  SpO2 93%  Physical Exam  Physical Exam  Constitutional: She is well-developed, well-nourished, and in no distress. No distress.  HENT:  Head: Normocephalic and atraumatic.  Left Ear: External ear normal.  Mouth/Throat: No oropharyngeal exudate.  Eyes: EOM are normal. Left eye exhibits no discharge. No scleral icterus.  Neck: Normal range of motion. Neck supple. No JVD present. No tracheal deviation present.  Cardiovascular: Normal heart sounds and intact distal pulses.  Exam reveals no gallop.   Pulmonary/Chest: No respiratory distress. She has no rales.  Abdominal: She exhibits no distension and no mass. There is tenderness. There is no rebound and no guarding.  Pain in epigastrium with palpation  Musculoskeletal: She exhibits no edema and no tenderness.  Lymphadenopathy:    She has no cervical adenopathy.  Skin: No rash noted. No erythema.    Lab Results  Component Value Date   TSH 3.894 04/12/2012   Lab Results  Component Value Date   WBC 7.9 04/12/2012   HGB 13.9 04/12/2012   HCT 42.1 04/12/2012   MCV 95.7 04/12/2012   PLT 277 04/12/2012    Lab Results  Component Value Date   CREATININE 0.65 04/12/2012   BUN 9 04/12/2012   NA 141 04/12/2012   K 4.7 04/12/2012   CL 106 04/12/2012   CO2 27 04/12/2012   Lab Results  Component Value Date   ALT 35 04/12/2012   AST 27 04/12/2012  ALKPHOS 72 04/12/2012   BILITOT 1.0 04/12/2012   Lab Results  Component Value Date   CHOL 227* 04/12/2012   Lab Results  Component Value Date   HDL 41 04/12/2012   Lab Results  Component Value Date   LDLCALC 160* 04/12/2012   Lab Results  Component Value Date   TRIG 130 04/12/2012   Lab Results  Component Value Date   CHOLHDL 5.5 04/12/2012     Assessment & Plan  Abdominal bloating Encouraged bland diet, with small frequent meals. Start a probiotic and a fiber supplement, avoid fatty and spicy foods. Tums qhs and Ranitidine and Omeprazole daily  Genital warts Referred to gyn for treatment  Chronic fatigue Very stressful new job encouraged rest and 7-8 hours sleep nightly  Anxiety and depression Given Buspar to try 10 mg bid for ongoing stress

## 2013-07-06 NOTE — Assessment & Plan Note (Signed)
Given Buspar to try 10 mg bid for ongoing stress

## 2013-07-06 NOTE — Assessment & Plan Note (Signed)
Very stressful new job encouraged rest and 7-8 hours sleep nightly

## 2013-08-03 ENCOUNTER — Other Ambulatory Visit: Payer: Self-pay | Admitting: Obstetrics and Gynecology

## 2014-05-05 ENCOUNTER — Ambulatory Visit (INDEPENDENT_AMBULATORY_CARE_PROVIDER_SITE_OTHER): Payer: BC Managed Care – PPO | Admitting: Family Medicine

## 2014-05-05 ENCOUNTER — Encounter: Payer: Self-pay | Admitting: Family Medicine

## 2014-05-05 VITALS — BP 154/86 | HR 86 | Temp 98.2°F | Resp 18 | Ht 63.5 in | Wt 230.0 lb

## 2014-05-05 DIAGNOSIS — F419 Anxiety disorder, unspecified: Principal | ICD-10-CM

## 2014-05-05 DIAGNOSIS — F341 Dysthymic disorder: Secondary | ICD-10-CM

## 2014-05-05 DIAGNOSIS — G4700 Insomnia, unspecified: Secondary | ICD-10-CM

## 2014-05-05 DIAGNOSIS — F32A Depression, unspecified: Secondary | ICD-10-CM

## 2014-05-05 DIAGNOSIS — E785 Hyperlipidemia, unspecified: Secondary | ICD-10-CM

## 2014-05-05 DIAGNOSIS — K5900 Constipation, unspecified: Secondary | ICD-10-CM

## 2014-05-05 DIAGNOSIS — F329 Major depressive disorder, single episode, unspecified: Secondary | ICD-10-CM

## 2014-05-05 DIAGNOSIS — K219 Gastro-esophageal reflux disease without esophagitis: Secondary | ICD-10-CM

## 2014-05-05 MED ORDER — RANITIDINE HCL 150 MG PO TABS: 150.0000 mg | ORAL_TABLET | Freq: Two times a day (BID) | ORAL | Status: DC | PRN

## 2014-05-05 MED ORDER — ALPRAZOLAM 0.25 MG PO TABS: 0.2500 mg | ORAL_TABLET | Freq: Two times a day (BID) | ORAL | Status: DC | PRN

## 2014-05-05 NOTE — Progress Notes (Signed)
Patient ID: Jordan Ellison, female   DOB: Jan 08, 1966, 48 y.o.   MRN: [id number] RAKEB KIBBLE [id number] [birth year] 05/05/2014      Progress Note-Follow Up  Subjective  Chief Complaint  Chief Complaint  Patient presents with  . Medication Refill    ranitidine    HPI  Patient is a 48 year old female in today for routine medical care. In today for followup. He is doing fairly well. Her anxiety and depression have responded to only occasional alprazolam use and she feels well. No recent illness. No polyuria or polydipsia. Continues to struggle with some low-grade constipation, heartburn and occasional anal fissure. Sees blood in those times but this is not uncommon. Denies CP/palp/SOB/HA/congestion/fevers or GU c/o. Taking meds as prescribed  Past Medical History  Diagnosis Date  . Depression   . Eating disorder   . Fainting spell   . Hyperlipidemia   . History of UTI   . Cervical cancer screening 02/23/2012  . Anxiety and depression 10/29/2010    Qualifier: Diagnosis of  By: Peggyann Juba FNP, Katrinka Blazing   . Insomnia 04/15/2012  . Genital warts 07/06/2013    Past Surgical History  Procedure Laterality Date  . Total abdominal hysterectomy  07/2005    Family History  Problem Relation Age of Onset  . Uterine cancer Mother   . Kidney failure Mother   . Cancer Mother     kidney, uterine  . Heart disease Mother   . Hypertension Mother   . Hyperlipidemia Mother   . Thyroid disease Mother     likely hyperactive  . Heart attack Father     deceased in 50  . Heart disease Father     MI  . Hyperlipidemia Father   . COPD Sister     smoker  . Other Sister     2 stents  . Heart disease Sister   . Hyperlipidemia Sister   . Obesity Sister   . Hypertension Sister   . Hyperlipidemia Sister   . Obesity Sister   . Thyroid disease Sister     hypothyroid  . Diabetes Sister     type 2- diet controlled  . GER disease Brother   . Hyperlipidemia Brother   . Hypertension Brother   .  Hyperlipidemia Brother   . Iron deficiency Brother   . Other Brother     pigment issue/ poor circulation  . Hyperlipidemia Brother   . Hemochromatosis Brother   . Peripheral vascular disease Brother   . Seizures Brother   . Other Brother     Epilepsy  . Thyroid disease Brother     hyper  . Diabetes Brother     type 2- diet controlled    History   Social History  . Marital Status: Single    Spouse Name: N/A    Number of Children: N/A  . Years of Education: N/A   Occupational History  . Not on file.   Social History Main Topics  . Smoking status: Former Games developer  . Smokeless tobacco: Not on file     Comment: quit smoking in 2000  . Alcohol Use: Yes     Comment: occasional glass of wine  . Drug Use: No  . Sexual Activity: No   Other Topics Concern  . 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 busPIRone (BUSPAR) 10 MG tablet Take 1 tablet (10 mg total) by mouth 2 (two) times daily as needed.  60 tablet  1  . FOLIC ACID PO Take 800 mg by mouth daily.       Marland Kitchen MAGNESIUM PO Take 400 mg by mouth 2 (two) times daily.       Marland Kitchen omeprazole (PRILOSEC) 40 MG capsule Take 1 capsule (40 mg total) by mouth daily.  30 capsule  3  . ranitidine (ZANTAC) 150 MG tablet Take 1 tablet (150 mg total) by mouth 2 (two) times daily as needed for heartburn.  60 tablet  3  . citalopram (CELEXA) 20 MG tablet Take 1 tablet (20 mg total) by mouth daily.  30 tablet  3  . Omega-3 Fatty Acids (FISH OIL) 1360 MG CAPS Take 1 capsule by mouth daily.      . polyethylene glycol (MIRALAX / GLYCOLAX) packet Take 17 g by mouth daily.        . ranitidine (ZANTAC) 150 MG  tablet Take 1 tablet (150 mg total) by mouth 2 (two) times daily.  60 tablet  5   No current facility-administered medications on file prior to visit.    Allergies  Allergen Reactions  . Sulfonamide Derivatives     REACTION: fever, weakness    Review of Systems  Review of Systems  Constitutional: Negative for fever and malaise/fatigue.  HENT: Negative for congestion.   Eyes: Negative for discharge.  Respiratory: Negative for shortness of breath.   Cardiovascular: Negative for chest pain, palpitations and leg swelling.  Gastrointestinal: Negative for nausea, abdominal pain and diarrhea.  Genitourinary: Negative for dysuria.  Musculoskeletal: Negative for falls.  Skin: Negative for rash.  Neurological: Negative for loss of consciousness and headaches.  Endo/Heme/Allergies: Negative for polydipsia.  Psychiatric/Behavioral: Negative for depression and suicidal ideas. The patient is nervous/anxious. The patient does not have insomnia.     Objective  BP 154/86  Pulse 86  Temp(Src) 98.2 F (36.8 C) (Oral)  Resp 18  Ht 5' 3.5" (1.613 m)  Wt 230 lb (104.327 kg)  BMI 40.10 kg/m2  SpO2 97%  Physical Exam  Physical Exam  Constitutional: She is oriented to person, place, and time and well-developed, well-nourished, and in no distress. No distress.  HENT:  Head: Normocephalic and atraumatic.  Eyes: Conjunctivae are normal.  Neck: Neck supple. No thyromegaly present.  Cardiovascular: Normal rate, regular rhythm and normal heart sounds.   No murmur heard. Pulmonary/Chest: Effort normal and breath sounds normal. She has no wheezes.  Abdominal: She exhibits no distension and no mass.  Musculoskeletal: She exhibits no edema.  Lymphadenopathy:    She has no cervical adenopathy.  Neurological: She is alert and oriented to person, place, and time.  Skin: Skin is warm and dry. No rash noted. She is not diaphoretic.  Psychiatric: Memory, affect and judgment normal.    Lab Results   Component Value Date   TSH 3.894 04/12/2012   Lab Results  Component Value Date   WBC 7.9 04/12/2012   HGB 13.9 04/12/2012   HCT 42.1 04/12/2012   MCV 95.7 04/12/2012   PLT 277 04/12/2012   Lab Results  Component Value Date   CREATININE 0.65 04/12/2012  BUN 9 04/12/2012   NA 141 04/12/2012   K 4.7 04/12/2012   CL 106 04/12/2012   CO2 27 04/12/2012   Lab Results  Component Value Date   ALT 35 04/12/2012   AST 27 04/12/2012   ALKPHOS 72 04/12/2012   BILITOT 1.0 04/12/2012   Lab Results  Component Value Date   CHOL 227* 04/12/2012   Lab Results  Component Value Date   HDL 41 04/12/2012   Lab Results  Component Value Date   LDLCALC 160* 04/12/2012   Lab Results  Component Value Date   TRIG 130 04/12/2012   Lab Results  Component Value Date   CHOLHDL 5.5 04/12/2012     Assessment & Plan    Anxiety and depression Doing better with new boss. Does not feel she needs the Citalopram. Uses Alprazolam prn very infrequently. Given refill today  HYPERLIPIDEMIA Encouraged heart healthy diet, increase exercise, avoid trans fats, consider a krill oil cap daily  CONSTIPATION Encouraged increased hydration and fiber in diet. Daily probiotics. If bowels not moving can use MOM 2 tbls po in 4 oz of warm prune juice by mouth every 2-3 days. If no results then repeat in 4 hours with  Dulcolax suppository pr, may repeat again in 4 more hours as needed. Seek care if symptoms worsen. Consider daily Miralax and/or Dulcolax if symptoms persist.   Insomnia Encouraged good sleep hygiene such as dark, quiet room. No blue/green glowing lights such as computer screens in bedroom. No alcohol or stimulants in evening. Cut down on caffeine as able. Regular exercise is helpful but not just prior to bed time.

## 2014-05-05 NOTE — Patient Instructions (Addendum)
Mylanta daily  Gastroesophageal Reflux Disease, Adult Gastroesophageal reflux disease (GERD) happens when acid from your stomach flows up into the esophagus. When acid comes in contact with the esophagus, the acid causes soreness (inflammation) in the esophagus. Over time, GERD may create small holes (ulcers) in the lining of the esophagus. CAUSES   Increased body weight. This puts pressure on the stomach, making acid rise from the stomach into the esophagus.  Smoking. This increases acid production in the stomach.  Drinking alcohol. This causes decreased pressure in the lower esophageal sphincter (valve or ring of muscle between the esophagus and stomach), allowing acid from the stomach into the esophagus.  Late evening meals and a full stomach. This increases pressure and acid production in the stomach.  A malformed lower esophageal sphincter. Sometimes, no cause is found. SYMPTOMS   Burning pain in the lower part of the mid-chest behind the breastbone and in the mid-stomach area. This may occur twice a week or more often.  Trouble swallowing.  Sore throat.  Dry cough.  Asthma-like symptoms including chest tightness, shortness of breath, or wheezing. DIAGNOSIS  Your caregiver may be able to diagnose GERD based on your symptoms. In some cases, X-rays and other tests may be done to check for complications or to check the condition of your stomach and esophagus. TREATMENT  Your caregiver may recommend over-the-counter or prescription medicines to help decrease acid production. Ask your caregiver before starting or adding any new medicines.  HOME CARE INSTRUCTIONS   Change the factors that you can control. Ask your caregiver for guidance concerning weight loss, quitting smoking, and alcohol consumption.  Avoid foods and drinks that make your symptoms worse, such as:  Caffeine or alcoholic drinks.  Chocolate.  Peppermint or mint flavorings.  Garlic and onions.  Spicy  foods.  Citrus fruits, such as oranges, lemons, or limes.  Tomato-based foods such as sauce, chili, salsa, and pizza.  Fried and fatty foods.  Avoid lying down for the 3 hours prior to your bedtime or prior to taking a nap.  Eat small, frequent meals instead of large meals.  Wear loose-fitting clothing. Do not wear anything tight around your waist that causes pressure on your stomach.  Raise the head of your bed 6 to 8 inches with wood blocks to help you sleep. Extra pillows will not help.  Only take over-the-counter or prescription medicines for pain, discomfort, or fever as directed by your caregiver.  Do not take aspirin, ibuprofen, or other nonsteroidal anti-inflammatory drugs (NSAIDs). SEEK IMMEDIATE MEDICAL CARE IF:   You have pain in your arms, neck, jaw, teeth, or back.  Your pain increases or changes in intensity or duration.  You develop nausea, vomiting, or sweating (diaphoresis).  You develop shortness of breath, or you faint.  Your vomit is green, yellow, black, or looks like coffee grounds or blood.  Your stool is red, bloody, or black. These symptoms could be signs of other problems, such as heart disease, gastric bleeding, or esophageal bleeding. MAKE SURE YOU:   Understand these instructions.  Will watch your condition.  Will get help right away if you are not doing well or get worse. Document Released: 09/10/2005 Document Revised: 02/23/2012 Document Reviewed: 06/20/2011 [facility]Exit[facility] Patient Information 2014 [place], [place]. DASH Diet The DASH diet stands for "Dietary Approaches to Stop Hypertension." It is a healthy eating plan that has been shown to reduce high blood pressure (hypertension) in as little as 14 days, while also possibly providing other significant health benefits.  These other health benefits include reducing the risk of breast cancer after menopause and reducing the risk of type 2 diabetes, heart disease, colon cancer, and stroke. Health  benefits also include weight loss and slowing kidney failure in patients with chronic kidney disease.  DIET GUIDELINES  Limit salt (sodium). Your diet should contain less than 1500 mg of sodium daily.  Limit refined or processed carbohydrates. Your diet should include mostly whole grains. Desserts and added sugars should be used sparingly.  Include small amounts of heart-healthy fats. These types of fats include nuts, oils, and tub margarine. Limit saturated and trans fats. These fats have been shown to be harmful in the body. CHOOSING FOODS  The following food groups are based on a 2000 calorie diet. See your Registered Dietitian for individual calorie needs. Grains and Grain Products (6 to 8 servings daily)  Eat More Often: Whole-wheat bread, brown rice, whole-grain or wheat pasta, quinoa, popcorn without added fat or salt (air popped).  Eat Less Often: White bread, white pasta, white rice, cornbread. Vegetables (4 to 5 servings daily)  Eat More Often: Fresh, frozen, and canned vegetables. Vegetables may be raw, steamed, roasted, or grilled with a minimal amount of fat.  Eat Less Often/Avoid: Creamed or fried vegetables. Vegetables in a cheese sauce. Fruit (4 to 5 servings daily)  Eat More Often: All fresh, canned (in natural juice), or frozen fruits. Dried fruits without added sugar. One hundred percent fruit juice ( cup [237 mL] daily).  Eat Less Often: Dried fruits with added sugar. Canned fruit in light or heavy syrup. [employer], Fish, and Poultry (2 servings or less daily. One serving is 3 to 4 oz [85-114 g]).  Eat More Often: Ninety percent or leaner ground beef, tenderloin, sirloin. Round cuts of beef, chicken breast, [place] breast. All fish. Grill, bake, or broil your meat. Nothing should be fried.  Eat Less Often/Avoid: Fatty cuts of meat, [place], or chicken leg, thigh, or wing. Fried cuts of meat or fish. Dairy (2 to 3 servings)  Eat More Often: Low-fat or fat-free milk,  low-fat plain or light yogurt, reduced-fat or part-skim cheese.  Eat Less Often/Avoid: Milk (whole, 2%).Whole milk yogurt. Full-fat cheeses. Nuts, Seeds, and Legumes (4 to 5 servings per week)  Eat More Often: All without added salt.  Eat Less Often/Avoid: Salted nuts and seeds, canned beans with added salt. Fats and Sweets (limited)  Eat More Often: Vegetable oils, tub margarines without trans fats, sugar-free gelatin. Mayonnaise and salad dressings.  Eat Less Often/Avoid: Coconut oils, palm oils, butter, stick margarine, cream, half and half, cookies, candy, pie. FOR MORE INFORMATION The Dash Diet Eating Plan: www.dashdiet.org Document Released: 11/20/2011 Document Revised: 02/23/2012 Document Reviewed: 11/20/2011 [facility] Patient Information 2014 [place], [place].

## 2014-05-05 NOTE — Progress Notes (Signed)
Pre visit review using our clinic review tool, if applicable. No additional management support is needed unless otherwise documented below in the visit note. 

## 2014-05-05 NOTE — Assessment & Plan Note (Signed)
Doing better with new boss. Does not feel she needs the Citalopram. Uses Alprazolam prn very infrequently. Given refill today

## 2014-05-08 NOTE — Assessment & Plan Note (Signed)
Encouraged increased hydration and fiber in diet. Daily probiotics. If bowels not moving can use MOM 2 tbls po in 4 oz of warm prune juice by mouth every 2-3 days. If no results then repeat in 4 hours with  Dulcolax suppository pr, may repeat again in 4 more hours as needed. Seek care if symptoms worsen. Consider daily Miralax and/or Dulcolax if symptoms persist.  

## 2014-05-08 NOTE — Assessment & Plan Note (Signed)
Encouraged good sleep hygiene such as dark, quiet room. No blue/green glowing lights such as computer screens in bedroom. No alcohol or stimulants in evening. Cut down on caffeine as able. Regular exercise is helpful but not just prior to bed time.  

## 2014-05-08 NOTE — Assessment & Plan Note (Signed)
Encouraged heart healthy diet, increase exercise, avoid trans fats, consider a krill oil cap daily 

## 2014-05-25 ENCOUNTER — Telehealth: Payer: Self-pay

## 2014-05-25 DIAGNOSIS — F329 Major depressive disorder, single episode, unspecified: Secondary | ICD-10-CM

## 2014-05-25 DIAGNOSIS — F419 Anxiety disorder, unspecified: Principal | ICD-10-CM

## 2014-05-25 MED ORDER — ALPRAZOLAM 0.25 MG PO TABS: 0.2500 mg | ORAL_TABLET | Freq: Two times a day (BID) | ORAL | Status: DC | PRN

## 2014-05-25 NOTE — Telephone Encounter (Signed)
jen informed pt that RX will be sent

## 2014-05-25 NOTE — Telephone Encounter (Signed)
She can have an rx for #2 tabs to help with her flights but that is all she will need to be more careful with her meds in future, they are controlled, I have no choice. Alprazolam 0.25 mg 1 tab po bid prn anxiety, disp #2 tabs

## 2014-05-25 NOTE — Telephone Encounter (Signed)
Going out of town tomorrow on the plane and she gets anxiety? Pt states she lost her medication for anxiety and the pharmacy told her this is to early to fill? Pt would like Dr Abner Greenspan to send in a 7 day supply?  Please advise?

## 2014-05-25 NOTE — Telephone Encounter (Signed)
Pt calling again, needs refill today.

## 2014-07-29 ENCOUNTER — Other Ambulatory Visit: Payer: Self-pay | Admitting: Family Medicine

## 2014-09-25 ENCOUNTER — Ambulatory Visit (INDEPENDENT_AMBULATORY_CARE_PROVIDER_SITE_OTHER): Payer: 59 | Admitting: Family Medicine

## 2014-09-25 ENCOUNTER — Encounter: Payer: Self-pay | Admitting: Family Medicine

## 2014-09-25 VITALS — BP 103/60 | HR 77 | Temp 98.2°F | Ht 63.5 in | Wt 228.6 lb

## 2014-09-25 DIAGNOSIS — R945 Abnormal results of liver function studies: Principal | ICD-10-CM

## 2014-09-25 DIAGNOSIS — K5900 Constipation, unspecified: Secondary | ICD-10-CM

## 2014-09-25 DIAGNOSIS — E785 Hyperlipidemia, unspecified: Secondary | ICD-10-CM

## 2014-09-25 DIAGNOSIS — R5383 Other fatigue: Secondary | ICD-10-CM

## 2014-09-25 DIAGNOSIS — Z0000 Encounter for general adult medical examination without abnormal findings: Secondary | ICD-10-CM

## 2014-09-25 DIAGNOSIS — R7989 Other specified abnormal findings of blood chemistry: Secondary | ICD-10-CM

## 2014-09-25 DIAGNOSIS — R739 Hyperglycemia, unspecified: Secondary | ICD-10-CM

## 2014-09-25 DIAGNOSIS — H65193 Other acute nonsuppurative otitis media, bilateral: Secondary | ICD-10-CM

## 2014-09-25 DIAGNOSIS — R350 Frequency of micturition: Secondary | ICD-10-CM

## 2014-09-25 MED ORDER — CEFDINIR 300 MG PO CAPS: 300.0000 mg | ORAL_CAPSULE | Freq: Two times a day (BID) | ORAL | Status: AC

## 2014-09-25 NOTE — Progress Notes (Signed)
Pre visit review using our clinic review tool, if applicable. No additional management support is needed unless otherwise documented below in the visit note. 

## 2014-09-25 NOTE — Assessment & Plan Note (Signed)
Encouraged increased hydration and fiber in diet. Daily probiotics. If bowels not moving can use MOM 2 tbls po in 4 oz of warm prune juice by mouth every 2-3 days. If no results then repeat in 4 hours with  Dulcolax suppository pr, may repeat again in 4 more hours as needed. Seek care if symptoms worsen. Consider daily Miralax and/or Dulcolax if symptoms persist.  

## 2014-09-25 NOTE — Patient Instructions (Addendum)
Look up bariatric surgery for weight loss on the [facility] website Mucinex twice daily, nasal saline flush twice daily and probiotics and Zyrtec 10 mg p daily when allergies or breathing symptoms worsen and notify us if no improvement   Otitis Media With Effusion Otitis media with effusion is the presence of fluid in the middle ear. This is a common problem in children, which often follows ear infections. It may be present for weeks or longer after the infection. Unlike an acute ear infection, otitis media with effusion refers only to fluid behind the ear drum and not infection. Children with repeated ear and sinus infections and allergy problems are the most likely to get otitis media with effusion. CAUSES  The most frequent cause of the fluid buildup is dysfunction of the eustachian tubes. These are the tubes that drain fluid in the ears to the back of the nose (nasopharynx). SYMPTOMS   The main symptom of this condition is hearing loss. As a result, you or your child may:  Listen to the TV at a loud volume.  Not respond to questions.  Ask "what" often when spoken to.  Mistake or confuse one sound or word for another.  There may be a sensation of fullness or pressure but usually not pain. DIAGNOSIS   Your health care provider will diagnose this condition by examining you or your child's ears.  Your health care provider may test the pressure in you or your child's ear with a tympanometer.  A hearing test may be conducted if the problem persists. TREATMENT   Treatment depends on the duration and the effects of the effusion.  Antibiotics, decongestants, nose drops, and cortisone-type drugs (tablets or nasal spray) may not be helpful.  Children with persistent ear effusions may have delayed language or behavioral problems. Children at risk for developmental delays in hearing, learning, and speech may require referral to a specialist earlier than children not at risk.  You or your child's  health care provider may suggest a referral to an ear, nose, and throat surgeon for treatment. The following may help restore normal hearing:  Drainage of fluid.  Placement of ear tubes (tympanostomy tubes).  Removal of adenoids (adenoidectomy). HOME CARE INSTRUCTIONS   Avoid secondhand smoke.  Infants who are breastfed are less likely to have this condition.  Avoid feeding infants while they are lying flat.  Avoid known environmental allergens.  Avoid people who are sick. SEEK MEDICAL CARE IF:   Hearing is not better in 3 months.  Hearing is worse.  Ear pain.  Drainage from the ear.  Dizziness. MAKE SURE YOU:   Understand these instructions.  Will watch your condition.  Will get help right away if you are not doing well or get worse. Document Released: 01/08/2005 Document Revised: 04/17/2014 Document Reviewed: 06/28/2013 [facility] Patient Information 2015 [place], [place]. This information is not intended to replace advice given to you by your health care provider. Make sure you discuss any questions you have with your health care provider.

## 2014-09-26 LAB — RENAL FUNCTION PANEL
ALBUMIN: 3.8 g/dL (ref 3.5–5.2)
BUN: 13 mg/dL (ref 6–23)
CALCIUM: 9.4 mg/dL (ref 8.4–10.5)
CO2: 20 meq/L (ref 19–32)
CREATININE: 0.7 mg/dL (ref 0.4–1.2)
Chloride: 105 mEq/L (ref 96–112)
GFR: 91.05 mL/min (ref >60.00)
Glucose, Bld: 73 mg/dL (ref 70–99)
Phosphorus: 4 mg/dL (ref 2.3–4.6)
Potassium: 4.1 mEq/L (ref 3.5–5.1)
Sodium: 136 mEq/L (ref 135–145)

## 2014-09-26 LAB — CBC
HEMATOCRIT: 45.8 % (ref 36.0–46.0)
HEMOGLOBIN: 14.5 g/dL (ref 12.0–15.0)
MCHC: 31.7 g/dL (ref 30.0–36.0)
MCV: 99.8 fl (ref 78.0–100.0)
Platelets: 254 10*3/uL (ref 150.0–400.0)
RBC: 4.59 Mil/uL (ref 3.87–5.11)
RDW: 13.7 % (ref 11.5–15.5)
WBC: 11.6 10*3/uL — ABNORMAL HIGH (ref 4.0–10.5)

## 2014-09-26 LAB — LIPID PANEL
CHOL/HDL RATIO: 7
Cholesterol: 208 mg/dL — ABNORMAL HIGH (ref 0–200)
HDL: 31 mg/dL — AB (ref >39.00)
LDL Cholesterol: 147 mg/dL — ABNORMAL HIGH (ref 0–99)
NONHDL: 177
Triglycerides: 152 mg/dL — ABNORMAL HIGH (ref 0.0–149.0)
VLDL: 30.4 mg/dL (ref 0.0–40.0)

## 2014-09-26 LAB — HEPATIC FUNCTION PANEL
ALBUMIN: 3.8 g/dL (ref 3.5–5.2)
ALT: 89 U/L — ABNORMAL HIGH (ref 0–35)
AST: 89 U/L — AB (ref 0–37)
Alkaline Phosphatase: 74 U/L (ref 39–117)
BILIRUBIN TOTAL: 1.2 mg/dL (ref 0.2–1.2)
Bilirubin, Direct: 0.2 mg/dL (ref 0.0–0.3)
Total Protein: 8.2 g/dL (ref 6.0–8.3)

## 2014-09-26 LAB — URINALYSIS, ROUTINE W REFLEX MICROSCOPIC
Hgb urine dipstick: NEGATIVE
Ketones, ur: NEGATIVE
Nitrite: NEGATIVE
RBC / HPF: NONE SEEN (ref 0–?)
Specific Gravity, Urine: >=1.03 — AB (ref 1.000–1.030)
Urine Glucose: NEGATIVE
Urobilinogen, UA: 0.2 (ref 0.0–1.0)
pH: 5.5 (ref 5.0–8.0)

## 2014-09-26 LAB — TSH: TSH: 0.92 u[IU]/mL (ref 0.35–4.50)

## 2014-09-27 LAB — URINE CULTURE: Colony Count: >=100000

## 2014-09-28 ENCOUNTER — Telehealth: Payer: Self-pay | Admitting: Family Medicine

## 2014-09-28 NOTE — Telephone Encounter (Signed)
Caller name: Nekayla Relation to pt: Call back number:[phone]   Reason for call:  Return call regarding lab results

## 2014-09-29 ENCOUNTER — Other Ambulatory Visit: Payer: Self-pay | Admitting: Family Medicine

## 2014-09-29 DIAGNOSIS — K5909 Other constipation: Secondary | ICD-10-CM

## 2014-09-29 DIAGNOSIS — R945 Abnormal results of liver function studies: Secondary | ICD-10-CM

## 2014-09-29 DIAGNOSIS — R109 Unspecified abdominal pain: Secondary | ICD-10-CM

## 2014-09-29 NOTE — Telephone Encounter (Signed)
Caller name: Lacresia  Relation to pt: Call back number: [phone]  Reason for call: Pt is returning call. Pt was not of area. Please advise.

## 2014-10-01 ENCOUNTER — Encounter: Payer: Self-pay | Admitting: Family Medicine

## 2014-10-01 DIAGNOSIS — R945 Abnormal results of liver function studies: Principal | ICD-10-CM

## 2014-10-01 DIAGNOSIS — Z0000 Encounter for general adult medical examination without abnormal findings: Secondary | ICD-10-CM | POA: Insufficient documentation

## 2014-10-01 DIAGNOSIS — R7989 Other specified abnormal findings of blood chemistry: Secondary | ICD-10-CM | POA: Insufficient documentation

## 2014-10-01 DIAGNOSIS — R5383 Other fatigue: Secondary | ICD-10-CM | POA: Insufficient documentation

## 2014-10-01 DIAGNOSIS — H65193 Other acute nonsuppurative otitis media, bilateral: Secondary | ICD-10-CM | POA: Insufficient documentation

## 2014-10-01 NOTE — Assessment & Plan Note (Signed)
Patient encouraged to maintain heart healthy diet, regular exercise, adequate sleep. Consider daily probiotics. Take medications as prescribed. Labs drawn and reviewed. Agrees to proced with 3 D mgm prior to next visit and return for pap at net visit. Encouraged referral to gastroenterology for evaluation but declines says she will consider at next visit. Will call if anything changes.

## 2014-10-01 NOTE — Assessment & Plan Note (Signed)
Encouraged heart healthy diet, increase exercise, avoid trans fats, consider a krill oil cap daily 

## 2014-10-01 NOTE — Assessment & Plan Note (Signed)
Mild, persistent will proceed with abdominal ultrasound.

## 2014-10-01 NOTE — Assessment & Plan Note (Signed)
Started on Omnicef and probiotics. Call if no improvement.

## 2014-10-01 NOTE — Progress Notes (Signed)
Patient ID: Jordan MullerDebora S Ellison, female   DOB: 1966/10/09, 48 y.o.   MRN: [id number] Jordan Ellison [id number] 1966/10/09 10/01/2014      Progress Note-Follow Up  Subjective  Chief Complaint  Chief Complaint  Patient presents with  . Annual Exam    physical    HPI  Patient is a 48 year old female in today for routine medical care. Has been struggling recently with some discomfort in her ear is with some itching and a sense of decreased hearing. She acknowledges this has been going on mildly for months but has recently worsened. Mild sense of feeling off balance without true spinning when she first or bends over. Has some mild nausea but no vomiting. No other recent illness or acute complaints noted. Does continue to struggle with low-grade constipation. Moves her bowels roughly twice a week. Denies CP/palp/SOB/HA/congestion/fevers/GI or GU c/o. Taking meds as prescribed  Past Medical History  Diagnosis Date  . Depression   . Eating disorder   . Fainting spell   . Hyperlipidemia   . History of UTI   . Cervical cancer screening 02/23/2012  . Anxiety and depression 10/29/2010    Qualifier: Diagnosis of  By: Peggyann Juba'Sullivan FNP, Katrinka BlazingMelissa S   . Insomnia 04/15/2012  . Genital warts 07/06/2013  . Hyperlipidemia 11/22/2010    Qualifier: Diagnosis of  By: Peggyann Juba'Sullivan FNP, Katrinka BlazingMelissa S     Past Surgical History  Procedure Laterality Date  . Total abdominal hysterectomy  07/2005    Family History  Problem Relation Age of Onset  . Uterine cancer Mother   . Kidney failure Mother   . Cancer Mother     kidney, uterine  . Heart disease Mother   . Hypertension Mother   . Hyperlipidemia Mother   . Thyroid disease Mother     likely hyperactive  . Heart attack Father     deceased in [id number]  . Heart disease Father     MI  . Hyperlipidemia Father   . COPD Sister     smoker  . Other Sister     2 stents  . Heart disease Sister   . Hyperlipidemia Sister   . Obesity Sister   . Hypertension Sister   .  Hyperlipidemia Sister   . Obesity Sister   . Thyroid disease Sister     hypothyroid  . Diabetes Sister     type 2- diet controlled  . GER disease Brother   . Hyperlipidemia Brother   . Hypertension Brother   . Hyperlipidemia Brother   . Iron deficiency Brother   . Other Brother     pigment issue/ poor circulation  . Hyperlipidemia Brother   . Hemochromatosis Brother   . Peripheral vascular disease Brother   . Seizures Brother   . Other Brother     Epilepsy  . Thyroid disease Brother     hyper  . Diabetes Brother     type 2- diet controlled    History   Social History  . Marital Status: Single    Spouse Name: N/A    Number of Children: N/A  . Years of Education: N/A   Occupational History  . Not on file.   Social History Main Topics  . Smoking status: Former Games developermoker  . Smokeless tobacco: Not on file     Comment: quit smoking in 2000  . Alcohol Use: Yes     Comment: occasional glass of wine  . Drug Use: No  . Sexual Activity: No  Other Topics Concern  . 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 ALPRAZolam (XANAX) 0.25 MG tablet Take 1 tablet (0.25 mg total) by mouth 2 (two) times daily as needed for anxiety.  2 tablet  0  . FOLIC ACID PO Take 600 mg by mouth daily.       Marland Kitchen. MAGNESIUM PO Take 400 mg by mouth 2 (two) times daily.       . polyethylene glycol (MIRALAX / GLYCOLAX) packet Take 17 g by mouth daily.        . ranitidine (ZANTAC) 150 MG tablet Take 1 tablet (150 mg total) by mouth 2 (two) times daily as needed for heartburn.  60 tablet  5   No current facility-administered medications on file prior to visit.     Allergies  Allergen Reactions  . Sulfonamide Derivatives     REACTION: fever, weakness    Review of Systems  Review of Systems  Constitutional: Negative for fever, chills and malaise/fatigue.  HENT: Positive for hearing loss. Negative for congestion and nosebleeds.   Eyes: Negative for discharge.  Respiratory: Negative for cough, sputum production, shortness of breath and wheezing.   Cardiovascular: Negative for chest pain, palpitations and leg swelling.  Gastrointestinal: Positive for nausea and constipation. Negative for heartburn, vomiting, abdominal pain, diarrhea and blood in stool.  Genitourinary: Negative for dysuria, urgency, frequency and hematuria.  Musculoskeletal: Negative for back pain, falls and myalgias.  Skin: Negative for rash.  Neurological: Negative for dizziness, tremors, sensory change, focal weakness, loss of consciousness, weakness and headaches.  Endo/Heme/Allergies: Negative for polydipsia. Does not bruise/bleed easily.  Psychiatric/Behavioral: Negative for depression and suicidal ideas. The patient is not nervous/anxious and does not have insomnia.     Objective  BP 103/60  Pulse 77  Temp(Src) 98.2 F (36.8 C) (Oral)  Ht 5' 3.5" (1.613 m)  Wt 228 lb 9.6 oz (103.692 kg)  BMI 39.85 kg/m2  SpO2 98%  Physical Exam  Physical Exam  Constitutional: She is oriented to person, place, and time and well-developed, well-nourished, and in no distress. No distress.  HENT:  Head: Normocephalic and atraumatic.  Eyes: Conjunctivae are normal.  Neck: Neck supple. No thyromegaly present.  Cardiovascular: Normal rate and regular rhythm.   No murmur heard. Pulmonary/Chest: Effort normal and breath sounds normal. She has no wheezes.  Abdominal: She exhibits no distension and no mass.  Musculoskeletal: She exhibits no edema.  Lymphadenopathy:    She has no cervical adenopathy.  Neurological: She is alert and oriented to person, place, and time.  Skin: Skin  is warm and dry. No rash noted. She is not diaphoretic.  Psychiatric: Memory, affect and judgment normal.    Lab Results  Component Value Date   TSH 0.92 09/25/2014   Lab Results  Component Value Date   WBC 11.6* 09/25/2014   HGB 14.5 09/25/2014   HCT 45.8 09/25/2014   MCV 99.8 09/25/2014   PLT 254.0 09/25/2014   Lab Results  Component Value Date   CREATININE 0.7 09/25/2014   BUN 13 09/25/2014  NA 136 09/25/2014   K 4.1 09/25/2014   CL 105 09/25/2014   CO2 20 09/25/2014   Lab Results  Component Value Date   ALT 89* 09/25/2014   AST 89* 09/25/2014   ALKPHOS 74 09/25/2014   BILITOT 1.2 09/25/2014   Lab Results  Component Value Date   CHOL 208* 09/25/2014   Lab Results  Component Value Date   HDL 31.00* 09/25/2014   Lab Results  Component Value Date   LDLCALC 147* 09/25/2014   Lab Results  Component Value Date   TRIG 152.0* 09/25/2014   Lab Results  Component Value Date   CHOLHDL 7 09/25/2014     Assessment & Plan  Constipation Encouraged increased hydration and fiber in diet. Daily probiotics. If bowels not moving can use MOM 2 tbls po in 4 oz of warm prune juice by mouth every 2-3 days. If no results then repeat in 4 hours with  Dulcolax suppository pr, may repeat again in 4 more hours as needed. Seek care if symptoms worsen. Consider daily Miralax and/or Dulcolax if symptoms persist.   Elevated liver function tests Mild, persistent will proceed with abdominal ultrasound.   Hyperlipidemia Encouraged heart healthy diet, increase exercise, avoid trans fats, consider a krill oil cap daily  Hyperglycemia Glucose wnl today. Minimize simple carbs.   Subacute nonsuppurative otitis media of both ears Started on Omnicef and probiotics. Call if no improvement.  Preventative health care Patient encouraged to maintain heart healthy diet, regular exercise, adequate sleep. Consider daily probiotics. Take medications as prescribed. Labs drawn and reviewed.  Agrees to proced with 3 D mgm prior to next visit and return for pap at net visit. Encouraged referral to gastroenterology for evaluation but declines says she will consider at next visit. Will call if anything changes.

## 2014-10-01 NOTE — Assessment & Plan Note (Signed)
Glucose wnl today. Minimize simple carbs.

## 2014-10-04 ENCOUNTER — Ambulatory Visit (HOSPITAL_BASED_OUTPATIENT_CLINIC_OR_DEPARTMENT_OTHER): Payer: 59

## 2014-10-06 ENCOUNTER — Ambulatory Visit (HOSPITAL_BASED_OUTPATIENT_CLINIC_OR_DEPARTMENT_OTHER)
Admission: RE | Admit: 2014-10-06 | Discharge: 2014-10-06 | Disposition: A | Discharge status: Home / Self Care | Payer: 59 | Source: Ambulatory Visit | Attending: Family Medicine | Admitting: Family Medicine

## 2014-10-06 DIAGNOSIS — R945 Abnormal results of liver function studies: Secondary | ICD-10-CM

## 2014-10-06 DIAGNOSIS — K7689 Other specified diseases of liver: Secondary | ICD-10-CM | POA: Insufficient documentation

## 2014-10-06 DIAGNOSIS — K5909 Other constipation: Secondary | ICD-10-CM | POA: Diagnosis not present

## 2014-10-06 DIAGNOSIS — R109 Unspecified abdominal pain: Secondary | ICD-10-CM

## 2015-01-26 ENCOUNTER — Ambulatory Visit: Payer: 59 | Admitting: Family Medicine

## 2015-03-02 ENCOUNTER — Encounter: Payer: Self-pay | Admitting: Family Medicine

## 2015-03-02 ENCOUNTER — Ambulatory Visit (INDEPENDENT_AMBULATORY_CARE_PROVIDER_SITE_OTHER): Payer: 59 | Admitting: Family Medicine

## 2015-03-02 VITALS — BP 104/62 | HR 97 | Temp 98.3°F | Ht 63.5 in | Wt 222.2 lb

## 2015-03-02 DIAGNOSIS — Z1211 Encounter for screening for malignant neoplasm of colon: Secondary | ICD-10-CM

## 2015-03-02 DIAGNOSIS — M25511 Pain in right shoulder: Secondary | ICD-10-CM

## 2015-03-02 DIAGNOSIS — R0789 Other chest pain: Secondary | ICD-10-CM

## 2015-03-02 DIAGNOSIS — R000 Tachycardia, unspecified: Secondary | ICD-10-CM

## 2015-03-02 DIAGNOSIS — K219 Gastro-esophageal reflux disease without esophagitis: Secondary | ICD-10-CM

## 2015-03-02 DIAGNOSIS — H547 Unspecified visual loss: Secondary | ICD-10-CM

## 2015-03-02 DIAGNOSIS — R945 Abnormal results of liver function studies: Secondary | ICD-10-CM

## 2015-03-02 DIAGNOSIS — M5481 Occipital neuralgia: Secondary | ICD-10-CM | POA: Insufficient documentation

## 2015-03-02 DIAGNOSIS — F419 Anxiety disorder, unspecified: Secondary | ICD-10-CM

## 2015-03-02 DIAGNOSIS — E782 Mixed hyperlipidemia: Secondary | ICD-10-CM

## 2015-03-02 DIAGNOSIS — R7989 Other specified abnormal findings of blood chemistry: Secondary | ICD-10-CM

## 2015-03-02 DIAGNOSIS — R7309 Other abnormal glucose: Secondary | ICD-10-CM

## 2015-03-02 DIAGNOSIS — F418 Other specified anxiety disorders: Secondary | ICD-10-CM

## 2015-03-02 DIAGNOSIS — F329 Major depressive disorder, single episode, unspecified: Secondary | ICD-10-CM

## 2015-03-02 DIAGNOSIS — Z0000 Encounter for general adult medical examination without abnormal findings: Secondary | ICD-10-CM

## 2015-03-02 DIAGNOSIS — H9313 Tinnitus, bilateral: Secondary | ICD-10-CM

## 2015-03-02 DIAGNOSIS — E785 Hyperlipidemia, unspecified: Secondary | ICD-10-CM

## 2015-03-02 DIAGNOSIS — R109 Unspecified abdominal pain: Secondary | ICD-10-CM

## 2015-03-02 DIAGNOSIS — F32A Depression, unspecified: Secondary | ICD-10-CM

## 2015-03-02 DIAGNOSIS — R739 Hyperglycemia, unspecified: Secondary | ICD-10-CM

## 2015-03-02 DIAGNOSIS — H9319 Tinnitus, unspecified ear: Secondary | ICD-10-CM

## 2015-03-02 HISTORY — DX: Tinnitus, unspecified ear: H93.19

## 2015-03-02 HISTORY — DX: Occipital neuralgia: M54.81

## 2015-03-02 LAB — COMPREHENSIVE METABOLIC PANEL
ALBUMIN: 4.1 g/dL (ref 3.5–5.2)
ALT: 41 U/L — AB (ref 0–35)
AST: 37 U/L (ref 0–37)
Alkaline Phosphatase: 80 U/L (ref 39–117)
BUN: 15 mg/dL (ref 6–23)
CO2: 20 meq/L (ref 19–32)
CREATININE: 0.56 mg/dL (ref 0.50–1.10)
Calcium: 9.1 mg/dL (ref 8.4–10.5)
Chloride: 107 mEq/L (ref 96–112)
Glucose, Bld: 105 mg/dL — ABNORMAL HIGH (ref 70–99)
POTASSIUM: 4.1 meq/L (ref 3.5–5.3)
Sodium: 139 mEq/L (ref 135–145)
TOTAL PROTEIN: 7 g/dL (ref 6.0–8.3)
Total Bilirubin: 0.8 mg/dL (ref 0.2–1.2)

## 2015-03-02 LAB — CBC
HEMATOCRIT: 40.4 % (ref 36.0–46.0)
Hemoglobin: 13.7 g/dL (ref 12.0–15.0)
MCH: 32.1 pg (ref 26.0–34.0)
MCHC: 33.9 g/dL (ref 30.0–36.0)
MCV: 94.6 fL (ref 78.0–100.0)
MPV: 11 fL (ref 8.6–12.4)
Platelets: 284 10*3/uL (ref 150–400)
RBC: 4.27 MIL/uL (ref 3.87–5.11)
RDW: 13.7 % (ref 11.5–15.5)
WBC: 10.2 10*3/uL (ref 4.0–10.5)

## 2015-03-02 LAB — LIPID PANEL
CHOL/HDL RATIO: 6.7 ratio
Cholesterol: 200 mg/dL (ref 0–200)
HDL: 30 mg/dL — AB (ref >=46)
LDL CALC: 129 mg/dL — AB (ref 0–99)
Triglycerides: 207 mg/dL — ABNORMAL HIGH (ref <150)
VLDL: 41 mg/dL — AB (ref 0–40)

## 2015-03-02 MED ORDER — RANITIDINE HCL 150 MG PO TABS: 150.0000 mg | ORAL_TABLET | Freq: Two times a day (BID) | ORAL | Status: AC | PRN

## 2015-03-02 MED ORDER — ALPRAZOLAM 0.25 MG PO TABS: 0.2500 mg | ORAL_TABLET | Freq: Two times a day (BID) | ORAL | Status: AC | PRN

## 2015-03-02 NOTE — Progress Notes (Signed)
Jordan Ellison  [id number] 1966-09-08 03/02/2015      Progress Note-Follow Up  Subjective  Chief Complaint  Chief Complaint  Patient presents with  . Follow-up    HPI  Patient is a 49 y.o. female in today for routine medical care. Patient in today with numerous complaints. She's been trying to eat better and is decreased her fats in her sodium but unfortunately continues to feel poorly. Continues to have heartburn and some epigastric discomfort. Described some atypical chest pressure at times. Denies any shortness of breath or palpitations with these episodes. No syncope. Is noting some bilateral tinnitus which is worsening as well as some mild decrease in visual acuity recently. No eye pain or loss of field of vision. She does note that her brother had cardiac arrest in the fall  and her stress levels increased since then. She is also complaining of persistent and worsening right shoulder pain with some mild decrease in range of motion as a result. She is requesting refill on her alprazolam which does help her anxiety attacks at times and she is taking it infrequently. Past Medical History  Diagnosis Date  . Depression   . Eating disorder   . Fainting spell   . Hyperlipidemia   . History of UTI   . Cervical cancer screening 02/23/2012  . Anxiety and depression 10/29/2010    Qualifier: Diagnosis of  By: Peggyann Juba FNP, Katrinka Blazing   . Insomnia 04/15/2012  . Genital warts 07/06/2013  . Hyperlipidemia 11/22/2010    Qualifier: Diagnosis of  By: Peggyann Juba FNP, Katrinka Blazing     Past Surgical History  Procedure Laterality Date  . Total abdominal hysterectomy  07/2005    Family History  Problem Relation Age of Onset  . Uterine cancer Mother   . Kidney failure Mother   . Cancer Mother     kidney, uterine  . Heart disease Mother   . Hypertension Mother   . Hyperlipidemia Mother   . Thyroid disease Mother     likely hyperactive  . Heart attack Father     deceased in 77  . Heart  disease Father     MI  . Hyperlipidemia Father   . COPD Sister     smoker  . Other Sister     2 stents  . Heart disease Sister   . Hyperlipidemia Sister   . Obesity Sister   . Hypertension Sister   . Hyperlipidemia Sister   . Obesity Sister   . Thyroid disease Sister     hypothyroid  . Diabetes Sister     type 2- diet controlled  . GER disease Brother   . Hyperlipidemia Brother   . Hypertension Brother   . Hyperlipidemia Brother   . Iron deficiency Brother   . Other Brother     pigment issue/ poor circulation  . Hyperlipidemia Brother   . Hemochromatosis Brother   . Peripheral vascular disease Brother   . Seizures Brother   . Other Brother     Epilepsy  . Thyroid disease Brother     hyper  . Diabetes Brother     type 2- diet controlled    History   Social History  . Marital Status: Single    Spouse Name: N/A  . Number of Children: N/A  . Years of Education: N/A   Occupational History  . Not on file.   Social History Main Topics  . Smoking status: Former Games developer  . Smokeless tobacco: Not on file  Comment: quit smoking in 2000  . Alcohol Use: Yes     Comment: occasional glass of wine  . Drug Use: No  . Sexual Activity: No   Other Topics Concern  . 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 ALPRAZolam (XANAX) 0.25 MG tablet Take 1 tablet (0.25 mg total) by mouth 2 (two) times daily as needed for anxiety. 2 tablet 0  . FOLIC ACID PO Take 600 mg by mouth daily.     Marland Kitchen. MAGNESIUM PO Take 400 mg by mouth 2 (two) times daily.     . polyethylene glycol (MIRALAX / GLYCOLAX) packet Take 17 g by mouth daily.       No  current facility-administered medications on file prior to visit.    Allergies  Allergen Reactions  . Sulfonamide Derivatives     REACTION: fever, weakness    Review of Systems  Review of Systems  Constitutional: Negative for fever and malaise/fatigue.  HENT: Positive for tinnitus. Negative for congestion and hearing loss.   Eyes: Negative for double vision, photophobia, pain and discharge.  Respiratory: Negative for shortness of breath.   Cardiovascular: Positive for chest pain and palpitations. Negative for leg swelling.  Gastrointestinal: Positive for heartburn and abdominal pain. Negative for nausea and diarrhea.  Genitourinary: Negative for dysuria.  Musculoskeletal: Positive for joint pain. Negative for falls.       Right shoulder pain  Skin: Negative for rash.  Neurological: Negative for loss of consciousness and headaches.  Endo/Heme/Allergies: Negative for polydipsia.  Psychiatric/Behavioral: Negative for depression and suicidal ideas. The patient is not nervous/anxious and does not have insomnia.     Objective  BP 104/62 mmHg  Pulse 97  Temp(Src) 98.3 F (36.8 C) (Oral)  Ht 5' 3.5" (1.613 m)  Wt 222 lb 4 oz (100.812 kg)  BMI 38.75 kg/m2  SpO2 97%  Physical Exam  Physical Exam  Constitutional: She is oriented to person, place, and time and well-developed, well-nourished, and in no distress. No distress.  HENT:  Head: Normocephalic and atraumatic.  Right Ear: External ear normal.  Left Ear: External ear normal.  Nose: Nose normal.  Mouth/Throat: Oropharynx is clear and moist. No oropharyngeal exudate.  Eyes: Conjunctivae are normal. Pupils are equal, round, and reactive to light. Right eye exhibits no discharge. Left eye exhibits no discharge. No scleral icterus.  Neck: Normal range of motion. Neck supple. No thyromegaly present.  Cardiovascular: Normal rate, regular rhythm, normal heart sounds and intact distal pulses.   No murmur heard. Pulmonary/Chest:  Effort normal and breath sounds normal. No respiratory distress. She has no wheezes. She has no rales.  Abdominal: Soft. Bowel sounds are normal. She exhibits no distension and no mass. There is no tenderness.  Musculoskeletal: Normal range of motion. She exhibits no edema or tenderness.  Slightly decreased rom in right shoulder  Lymphadenopathy:    She has no cervical adenopathy.  Neurological: She is alert and oriented to person, place, and time. She has normal reflexes. No cranial nerve deficit. Coordination normal.  Skin: Skin is warm and  dry. No rash noted. She is not diaphoretic.  Psychiatric: Mood, memory, affect and judgment normal.    Lab Results  Component Value Date   TSH 0.92 09/25/2014   Lab Results  Component Value Date   WBC 11.6* 09/25/2014   HGB 14.5 09/25/2014   HCT 45.8 09/25/2014   MCV 99.8 09/25/2014   PLT 254.0 09/25/2014   Lab Results  Component Value Date   CREATININE 0.7 09/25/2014   BUN 13 09/25/2014   NA 136 09/25/2014   K 4.1 09/25/2014   CL 105 09/25/2014   CO2 20 09/25/2014   Lab Results  Component Value Date   ALT 89* 09/25/2014   AST 89* 09/25/2014   ALKPHOS 74 09/25/2014   BILITOT 1.2 09/25/2014   Lab Results  Component Value Date   CHOL 208* 09/25/2014   Lab Results  Component Value Date   HDL 31.00* 09/25/2014   Lab Results  Component Value Date   LDLCALC 147* 09/25/2014   Lab Results  Component Value Date   TRIG 152.0* 09/25/2014   Lab Results  Component Value Date   CHOLHDL 7 09/25/2014     Assessment & Plan  Tinnitus B/l has happened numerous times, comes and goes, has not noticed any significant hearing loss.    Decreased visual acuity In a diabetic will refer to opthamologist for further evaluation   Preventative health care Patient encouraged to maintain heart healthy diet, regular exercise, adequate sleep. Consider daily probiotics. Take medications as prescribed. Labs ordered and revied referred to  gastroenterology for screening colonoscopy. Pap UTD. Encouraged to proceed with MGM   Hyperlipidemia Improved some. Encouraged heart healthy diet, increase exercise, avoid trans fats, consider a krill oil cap daily   Elevated liver function tests Acute hepatitis panel negative. Minimize simple carbs and saturated fats   GERD (gastroesophageal reflux disease) Avoid offending foods, start probiotics. Do not eat large meals in late evening and consider raising head of bed. Ranitidine as needed   Hyperglycemia hgba1c acceptable, minimize simple carbs. Increase exercise as tolerated.    Atypical chest pain With some tachycardia and cardiac risk factors including hyperlipidemia, hyperglycemia will refer to cardiology for further consideration   Pain in joint, shoulder region Right side and persistent no trauma but persistent symptoms causing dysfunction. Encouraged moist heat and gentle stretching, topical salon pas and referred to sports med for further consideration

## 2015-03-02 NOTE — Progress Notes (Signed)
Pre visit review using our clinic review tool, if applicable. No additional management support is needed unless otherwise documented below in the visit note. 

## 2015-03-02 NOTE — Patient Instructions (Signed)
Bicipital Tendonitis Bicipital tendonitis refers to redness, soreness, and swelling (inflammation) or irritation of the bicep tendon. The biceps muscle is located between the elbow and shoulder of the inner arm. The tendon heads, similar to pieces of rope, connect the bicep muscle to the shoulder socket. They are called short head and long head tendons. When tendonitis occurs, the long head tendon is inflamed and swollen, and may be thickened or partially torn.  Bicipital tendonitis can occur with other problems as well, such as arthritis in the shoulder or acromioclavicular joints, tears in the tendons, or other rotator cuff problems.  CAUSES  Overuse of of the arms for overhead activities is the major cause of tendonitis. Many athletes, such as swimmers, baseball players, and tennis players are prone to bicipital tendonitis. Jobs that require manual labor or routine chores, especially chores involving overhead activities can result in overuse and tendonitis. SYMPTOMS Symptoms may include:  Pain in and around the front of the shoulder. Pain may be worse with overhead motion.  Pain or aching that radiates down the arm.  Clicking or shifting sensations in the shoulder. DIAGNOSIS Your caregiver may perform the following:  Physical exam and tests of the biceps and shoulder to observe range of motion, strength, and stability.  X-rays or magnetic resonance imaging (MRI) to confirm the diagnosis. In most common cases, these tests are not necessary. Since other problems may exist in the shoulder or rotator cuff, additional tests may be recommended. TREATMENT Treatment may include the following:  Medications  Your caregiver may prescribe over-the-counter pain relievers.  Steroid injections, such as cortisone, may be recommended. These may help to reduce inflammation and pain.  Physical Therapy - Your caregiver may recommend gentle exercises with the arm. These can help restore strength and range  of motion. They may be done at home or with a physical therapist's supervision and input.  Surgery - Arthroscopic or open surgery sometimes is necessary. Surgery may include:  Reattachment or repair of the tendon at the shoulder socket.  Removal of the damaged section of the tendon.  Anchoring the tendon to a different area of the shoulder (tenodesis). HOME CARE INSTRUCTIONS   Avoid overhead motion of the affected arm or any other motion that causes pain.  Take medication for pain as directed. Do not take these for more than 3 weeks, unless directed to do so by your caregiver.  Ice the affected area for 20 minutes at a time, 3-4 times per day. Place a towel on the skin over the painful area and the ice or cold pack over the towel. Do not place ice directly on the skin.  Perform gentle exercises at home as directed. These will increase strength and flexibility. PREVENTION  Modify your activities as much as possible to protect your arm. A physical therapist or sports medicine physician can help you understand options for safe motion.  Avoid repetitive overhead pulling, lifting, reaching, and throwing until your caregiver tells you it is ok to resume these activities. SEEK MEDICAL CARE IF:  Your pain worsens.  You have difficulty moving the affected arm.  You have trouble performing any of the self-care instructions. MAKE SURE YOU:   Understand these instructions.  Will watch your condition.  Will get help right away if you are not doing well or get worse. Document Released: 01/03/2011 Document Revised: 02/23/2012 Document Reviewed: 01/03/2011 Exit[employer] Patient Information 2015 [employer]. This information is not intended to replace advice given to you by your health care provider.   Make sure you discuss any questions you have with your health care provider.  

## 2015-03-02 NOTE — Assessment & Plan Note (Signed)
B/l has happened numerous times, comes and goes, has not noticed any significant hearing loss.

## 2015-03-03 LAB — HEMOGLOBIN A1C
HEMOGLOBIN A1C: 5.9 % — AB (ref <5.7)
Mean Plasma Glucose: 123 mg/dL — ABNORMAL HIGH (ref <117)

## 2015-03-03 LAB — HEPATITIS PANEL, ACUTE
HCV Ab: NEGATIVE
Hep A IgM: NONREACTIVE
Hep B C IgM: NONREACTIVE
Hepatitis B Surface Ag: NEGATIVE

## 2015-03-03 LAB — TSH: TSH: 2.51 u[IU]/mL (ref 0.350–4.500)

## 2015-03-06 ENCOUNTER — Telehealth: Payer: Self-pay | Admitting: Family Medicine

## 2015-03-06 ENCOUNTER — Encounter: Payer: Self-pay | Admitting: Family Medicine

## 2015-03-06 DIAGNOSIS — R000 Tachycardia, unspecified: Secondary | ICD-10-CM | POA: Insufficient documentation

## 2015-03-06 DIAGNOSIS — R0789 Other chest pain: Secondary | ICD-10-CM | POA: Insufficient documentation

## 2015-03-06 DIAGNOSIS — H547 Unspecified visual loss: Secondary | ICD-10-CM

## 2015-03-06 DIAGNOSIS — M25519 Pain in unspecified shoulder: Secondary | ICD-10-CM | POA: Insufficient documentation

## 2015-03-06 HISTORY — DX: Unspecified visual loss: H54.7

## 2015-03-06 NOTE — Assessment & Plan Note (Signed)
Patient encouraged to maintain heart healthy diet, regular exercise, adequate sleep. Consider daily probiotics. Take medications as prescribed. Labs ordered and revied referred to gastroenterology for screening colonoscopy. Pap UTD. [facility]

## 2015-03-06 NOTE — Assessment & Plan Note (Signed)
Acute hepatitis panel negative. Minimize simple carbs and saturated fats

## 2015-03-06 NOTE — Telephone Encounter (Signed)
Could you complete this referral for Dr. Abner GreenspanBlyth

## 2015-03-06 NOTE — Telephone Encounter (Signed)
Caller name: Fleet [facility]achel [facility]   Call back number: [phone]   Reason for call:  Pt requesting a referral to Dr. Retta MacAndrew Simpson optometrist contact [phone] and fax [phone]. Requesting office notes

## 2015-03-06 NOTE — Assessment & Plan Note (Signed)
Right side and persistent no trauma but persistent symptoms causing dysfunction. Encouraged moist heat and gentle stretching, topical salon pas and referred to sports med for further consideration

## 2015-03-06 NOTE — Assessment & Plan Note (Signed)
Improved some. Encouraged heart healthy diet, increase exercise, avoid trans fats, consider a krill oil cap daily

## 2015-03-06 NOTE — Telephone Encounter (Signed)
Ok for referral? Please advise on the Dx and I will place.

## 2015-03-06 NOTE — Assessment & Plan Note (Signed)
hgba1c acceptable, minimize simple carbs. Increase exercise as tolerated.  

## 2015-03-06 NOTE — Telephone Encounter (Signed)
Referral done

## 2015-03-06 NOTE — Assessment & Plan Note (Signed)
With some tachycardia and cardiac risk factors including hyperlipidemia, hyperglycemia will refer to cardiology for further consideration

## 2015-03-06 NOTE — Telephone Encounter (Signed)
The referral was already placed, it was placed for decreased visual acuity no pain or urgent symptom. I will finish the note now.

## 2015-03-06 NOTE — Assessment & Plan Note (Signed)
In a diabetic will refer to opthamologist for further evaluation

## 2015-03-06 NOTE — Assessment & Plan Note (Signed)
Avoid offending foods, start probiotics. Do not eat large meals in late evening and consider raising head of bed. Ranitidine as needed

## 2015-03-07 ENCOUNTER — Encounter: Payer: Self-pay | Admitting: Gastroenterology

## 2015-03-08 ENCOUNTER — Ambulatory Visit (INDEPENDENT_AMBULATORY_CARE_PROVIDER_SITE_OTHER): Payer: 59 | Admitting: Family Medicine

## 2015-03-08 ENCOUNTER — Ambulatory Visit: Payer: 59 | Admitting: Family Medicine

## 2015-03-08 ENCOUNTER — Encounter: Payer: Self-pay | Admitting: Family Medicine

## 2015-03-08 VITALS — BP 145/84 | HR 84 | Ht 64.0 in | Wt 222.0 lb

## 2015-03-08 DIAGNOSIS — M542 Cervicalgia: Secondary | ICD-10-CM | POA: Diagnosis not present

## 2015-03-08 DIAGNOSIS — M62838 Other muscle spasm: Secondary | ICD-10-CM

## 2015-03-08 DIAGNOSIS — M6248 Contracture of muscle, other site: Secondary | ICD-10-CM | POA: Diagnosis not present

## 2015-03-08 NOTE — Patient Instructions (Addendum)
Your pain is due to muscle spasms that sometimes cause peripheral nerve irritation. Physical therapy is the most likely thing that will make you better. Do home exercises on days you don't go to therapy - increasing flexibility and strength of the affected muscles will cause them to spasm less. Aleve 2 tabs twice a day with food OR ibuprofen 600mg  three times a day with food for pain and inflammation as needed. Follow up with me in about 6 weeks. If not improving I would consider the rheumatologic testing but this is likely to be normal.

## 2015-03-14 DIAGNOSIS — M542 Cervicalgia: Secondary | ICD-10-CM | POA: Insufficient documentation

## 2015-03-14 NOTE — Assessment & Plan Note (Signed)
with some radiation into right arm at times.  Consistent with spasms of trapezius with some peripheral nerve irritation - radiculopathy also a possibility.  Start with physical therapy, home exercises.  NSAIDs as needed.  F/u in 6 weeks.

## 2015-03-14 NOTE — Progress Notes (Signed)
PCP: Danise EdgeBLYTH, STACEY, MD  Subjective:   HPI: Patient is a 49 y.o. female here for neck pain.  Patient reports for over a year she's had tension, pain, knot in right side of neck. Has been worse over past week with associated spasms. Pain can go down shoulder into arm mainly on right side. No numbness or tingling. No bowel/bladder dysfunction.  Past Medical History  Diagnosis Date  . Depression   . Eating disorder   . Fainting spell   . Hyperlipidemia   . History of UTI   . Cervical cancer screening 02/23/2012  . Anxiety and depression 10/29/2010    Qualifier: Diagnosis of  By: Peggyann Juba'Sullivan FNP, Katrinka BlazingMelissa S   . Insomnia 04/15/2012  . Genital warts 07/06/2013  . Hyperlipidemia 11/22/2010    Qualifier: Diagnosis of  By: Peggyann Juba'Sullivan FNP, Katrinka BlazingMelissa S   . Tinnitus 03/02/2015  . Occipital neuralgia 03/02/2015  . Decreased visual acuity 03/06/2015  . GERD (gastroesophageal reflux disease) 10/29/2010    Qualifier: Diagnosis of  By: Peggyann Juba'Sullivan FNP, Katrinka BlazingMelissa S     Current Outpatient Prescriptions on File Prior to Visit  Medication Sig Dispense Refill  . ALPRAZolam (XANAX) 0.25 MG tablet Take 1 tablet (0.25 mg total) by mouth 2 (two) times daily as needed for anxiety. 40 tablet 0  . FOLIC ACID PO Take 600 mg by mouth daily.     Marland Kitchen. MAGNESIUM PO Take 400 mg by mouth 2 (two) times daily.     . polyethylene glycol (MIRALAX / GLYCOLAX) packet Take 17 g by mouth daily.      . Probiotic Product (TRUNATURE DIGESTIVE PROBIOTIC) CAPS Take by mouth daily.    . ranitidine (ZANTAC) 150 MG tablet Take 1 tablet (150 mg total) by mouth 2 (two) times daily as needed for heartburn. 180 tablet 3   No current facility-administered medications on file prior to visit.    Past Surgical History  Procedure Laterality Date  . Total abdominal hysterectomy  07/2005    Allergies  Allergen Reactions  . Iodine   . Sulfonamide Derivatives     REACTION: fever, weakness  . Tylenol With Codeine #3 [Acetaminophen-Codeine]      History   Social History  . Marital Status: Single    Spouse Name: N/A  . Number of Children: N/A  . Years of Education: N/A   Occupational History  . Not on file.   Social History Main Topics  . Smoking status: Former Games developermoker  . Smokeless tobacco: Not on file     Comment: quit smoking in 2000  . Alcohol Use: 0.0 oz/week    0 Standard drinks or equivalent per week     Comment: occasional glass of wine  . Drug Use: No  . Sexual Activity: No   Other Topics Concern  . 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 Uterine cancer Mother   . Kidney failure Mother   . Cancer Mother     kidney, uterine  . Heart disease Mother   . Hypertension Mother   . Hyperlipidemia Mother   . Thyroid disease Mother  likely hyperactive  . Heart attack Father     deceased in 89  . Heart disease Father     MI  . Hyperlipidemia Father   . COPD Sister     smoker  . Other Sister     2 stents  . Heart disease Sister   . Hyperlipidemia Sister   . Obesity Sister   . Hypertension Sister   . Hyperlipidemia Sister   . Obesity Sister   . Thyroid disease Sister     hypothyroid  . Diabetes Sister     type 2- diet controlled  . GER disease Brother   . Hyperlipidemia Brother   . Hypertension Brother   . Hyperlipidemia Brother   . Iron deficiency Brother   . Other Brother     pigment issue/ poor circulation  . Hyperlipidemia Brother   . Hemochromatosis Brother   . Peripheral vascular disease Brother   . Seizures Brother   . Other Brother     Epilepsy  . Thyroid disease Brother     hyper  . Diabetes Brother     type 2- diet controlled    BP 145/84 mmHg  Pulse 84  Ht   (1.626 m)  Wt 222 lb (100.699 kg)  BMI 38.09 kg/m2  Review of Systems: See HPI above.    Objective:  Physical Exam:  Gen: NAD  Neck: No gross deformity, swelling, bruising. TTP mildly right paraspinal region and just medial to medial scapular border.  No midline/bony TTP. FROM. BUE strength 5/5.   Sensation intact to light touch.   2+ equal reflexes in triceps, biceps, brachioradialis tendons. Negative spurlings. NV intact distal BUEs.    Assessment & Plan:  1. Neck pain - with some radiation into right arm at times.  Consistent with spasms of trapezius with some peripheral nerve irritation - radiculopathy also a possibility.  Start with physical therapy, home exercises.  NSAIDs as needed.  F/u in 6 weeks.

## 2015-04-10 ENCOUNTER — Ambulatory Visit: Payer: 59 | Admitting: Cardiovascular Disease

## 2015-04-13 ENCOUNTER — Ambulatory Visit: Payer: 59 | Admitting: Family Medicine

## 2015-04-13 ENCOUNTER — Telehealth: Payer: Self-pay | Admitting: *Deleted

## 2015-04-13 NOTE — Telephone Encounter (Signed)
[facility]  Hilda LiasMarie PV

## 2015-04-13 NOTE — Telephone Encounter (Signed)
Atypical chest pain With some tachycardia and cardiac risk factors including hyperlipidemia, hyperglycemia will refer to cardiology for further consideration    Dr Russella DarStark, This  note is in the chart from this Pt's 02-2015 Ov with her PCP Reuel DerbyStacy Blyth. She has an OV scheduled with Dr Eden EmmsNishan for 05-16-2015 but her direct colon with you is 5-19 Thursday .  Do you want her colo  Cancelled until she gets cardiac clearance or proceed?  Thanks for you time and advice, Jordan Ellison PV

## 2015-04-13 NOTE — Telephone Encounter (Signed)
Spoke with PT and explained per Dr Russella DarStark we need to get cardiac clearance prior to her having a colon. Instructed her we will cancel her colon and PV for may and once she gets cleared from cardiology, she needs to cal back and RS. Pt verbalized understanding of these instructions and states she will call after her cardio. Is complete.  Cancelled PV and 5-19 colon as per Dr stark  Jordan Ellison PV

## 2015-04-13 NOTE — Telephone Encounter (Signed)
Please postpone colonoscopy until cardiology has evaluated her and cleared her to proceed.

## 2015-04-19 ENCOUNTER — Ambulatory Visit: Payer: 59 | Admitting: Family Medicine

## 2015-04-24 ENCOUNTER — Encounter: Payer: Self-pay | Admitting: Physical Therapy

## 2015-04-24 ENCOUNTER — Ambulatory Visit: Payer: 59 | Attending: Family Medicine | Admitting: Physical Therapy

## 2015-04-24 DIAGNOSIS — M542 Cervicalgia: Secondary | ICD-10-CM | POA: Diagnosis not present

## 2015-04-24 NOTE — Therapy (Signed)
[facility] [address]  [address] [place], [place], [address] Phone: [phone]   Fax:  [phone]  Physical Therapy Evaluation  Patient Details  Name: Jordan Ellison MRN: [id number] Date of Birth: December 29, 1965 Referring Provider:  Lenda Kelp, MD  Encounter Date: 04/24/2015      PT End of Session - 04/24/15 0950    Visit Number 1   Number of Visits 12   Date for PT Re-Evaluation 06/05/15   PT Start Time 0938   PT Stop Time 1021   PT Time Calculation (min) 43 min      Past Medical History  Diagnosis Date  . Depression   . Eating disorder   . Fainting spell   . Hyperlipidemia   . History of UTI   . Cervical cancer screening 02/23/2012  . Anxiety and depression 10/29/2010    Qualifier: Diagnosis of  By: Peggyann Juba FNP, Katrinka Blazing   . Insomnia 04/15/2012  . Genital warts 07/06/2013  . Hyperlipidemia 11/22/2010    Qualifier: Diagnosis of  By: Peggyann Juba FNP, Katrinka Blazing   . Tinnitus 03/02/2015  . Occipital neuralgia 03/02/2015  . Decreased visual acuity 03/06/2015  . GERD (gastroesophageal reflux disease) 10/29/2010    Qualifier: Diagnosis of  By: Peggyann Juba FNP, Katrinka Blazing     Past Surgical History  Procedure Laterality Date  . Total abdominal hysterectomy  07/2005    There were no vitals filed for this visit.  Visit Diagnosis:  Neck pain - Plan: PT plan of care cert/re-cert      Subjective Assessment - 04/24/15 0943    Subjective pt with c/o neck pain over the past several years but states frequency of pain has worsened lately and notes knotting sensation and experiences headaches when knot is present. She notes headaches 1x every 1-2 weeks which lasts all day.  Has difficulty sleeping at times due to neck pain.  Denies N/T.   Patient Stated Goals feel better   Currently in Pain? No/denies   Pain Score --  States pain is intermittent in nature but has not been present past few days.  When pain is present she  rates pain 7-8/10.   Pain Location Neck   Pain Orientation Posterior;Upper   Pain Radiating Towards pain mostly in upper c-spine but states extends down B upper scapula at times   Pain Frequency Occasional   Aggravating Factors  unknown but states often noted after activites such as moving / lifting boxes (not during these activities however).   Pain Relieving Factors sit or lie down   Multiple Pain Sites No            [facility] PT Assessment - 04/24/15 0001    Assessment   Medical Diagnosis neck pain   Onset Date 05/15/12   Balance Screen   Has the patient fallen in the past 6 months No   Has the patient had a decrease in activity level because of a fear of falling?  No   Is the patient reluctant to leave their home because of a fear of falling?  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   Leisure denies regular exercise   Observation/Other Assessments   Focus on Therapeutic Outcomes (FOTO)  48% limitation   ROM / Strength   AROM /  PROM / Strength AROM;Strength   AROM   Overall AROM Comments B Shoulder AROM WNL without pain   AROM Assessment Site Cervical   Cervical Flexion WFL  mild lower c-spine pain   Cervical Extension 60  no pain but c/o mild dizzy   Cervical - Right Side Bend 36   Cervical - Left Side Bend 32   Cervical - Right Rotation 65   Cervical - Left Rotation 68   Strength   Overall Strength Comments B Shoulder 5/5 grossly without pain   Strength Assessment Site --   Right/Left Shoulder --   Palpation   Palpation mild TTP upper c-spine paraspinals and B upper traps (mild increased tone in this area)   Special Tests    Special Tests Cervical   Cervical Tests other   other    Findings Negative   Comment quadrant           TODAY'S TREATMENT TherEx - instruct and perform HEP for  stretch to UT, Scalenes, and chin tuck            PT Education - 04/24/15 1156    Education provided Yes   Education Details initial HEP   Person(s) Educated Patient   Methods Explanation;Demonstration;Handout   Comprehension Verbalized understanding;Returned demonstration          PT Short Term Goals - 04/24/15 1044    PT SHORT TERM GOAL #1   Title independent with hep for neck stretching by 05/01/15   Status New   PT SHORT TERM GOAL #2   Title pt assess for vertigo and LTG written as appropriate by 05/04/15   Status New           PT Long Term Goals - 04/24/15 1044    PT LONG TERM GOAL #1   Title pt independent with advanced HEP as necessary by 06/05/15   Status New   PT LONG TERM GOAL #2   Title c-spine AROM B rotation 70dg or better and B SB without pain by 06/05/15   Status New   PT LONG TERM GOAL #3   Title pt denies noting cerviogenic headache for at least 2 weeks by 06/05/15   Status New               Plan - 04/24/15 1004    Clinical Impression Statement pt with c/o intermittent neck pain with headaches and spasms into B upper scapular muscles.  No pain or significant tenderness noted today but mild LOM into B c-spine rotation noted.  Seems pt with mild tightness B scalenes and UT and experiences intermittent spasms / trigger point into UT and/or suboccipital muscles.  Will begin with stretching HEP and continue to assess and treat neck as indicated.  Pt with secondary c/o vertigo which seems positional in nature.  Since this influences neck treatments we will assess and treat this as well.   Pt will benefit from skilled therapeutic intervention in order to improve on the following deficits Pain;Decreased mobility;Decreased range of motion;Postural dysfunction;Other (comment)  vertigo   Rehab Potential Good   PT Frequency 2x / week   PT Duration 6 weeks   PT Treatment/Interventions Manual techniques;Therapeutic exercise;Therapeutic activities;Traction;Dry  needling;Patient/family education;ADLs/Self Care Home Management;Moist Heat;Electrical Stimulation;Neuromuscular re-education   PT Next Visit Plan pt to be seen and assessed by Sherrlyn HockStephanie Mathews, PT due to vertigo at next f/u.   Consulted and Agree with Plan of Care Patient         Problem List Patient Active  Problem List   Diagnosis Date Noted  . Neck pain 03/14/2015  . Decreased visual acuity 03/06/2015  . Tachycardia 03/06/2015  . Atypical chest pain 03/06/2015  . Pain in joint, shoulder region 03/06/2015  . Tinnitus 03/02/2015  . Occipital neuralgia 03/02/2015  . Other fatigue 10/01/2014  . Subacute nonsuppurative otitis media of both ears 10/01/2014  . Elevated liver function tests 10/01/2014  . Preventative health care 10/01/2014  . Genital warts 07/06/2013  . Insomnia 04/15/2012  . Cervical cancer screening 02/23/2012  . Abdominal bloating 04/22/2011  . Chronic fatigue 04/22/2011  . Hyperlipidemia 11/22/2010  . ENDOMETRIOSIS 11/22/2010  . Hyperglycemia 11/22/2010  . Constipation 11/05/2010  . RECTAL BLEEDING 11/05/2010  . Anxiety and depression 10/29/2010  . RECTAL FISSURE 10/29/2010  . MENOPAUSE, SURGICAL 10/29/2010  . LEG CRAMPS 10/29/2010  . GERD (gastroesophageal reflux disease) 10/29/2010    Dari Carpenito PT, OCS 04/24/2015, 11:58 AM  [facility] [address]  [address] [place], [name], [address] Phone: [phone]   Fax:  [phone]

## 2015-05-01 ENCOUNTER — Ambulatory Visit: Payer: 59 | Admitting: Physical Therapy

## 2015-05-01 DIAGNOSIS — M542 Cervicalgia: Secondary | ICD-10-CM

## 2015-05-01 DIAGNOSIS — H8113 Benign paroxysmal vertigo, bilateral: Secondary | ICD-10-CM

## 2015-05-01 MED ORDER — ONDANSETRON HCL 4 MG PO TABS: 4.0000 mg | ORAL_TABLET | Freq: Three times a day (TID) | ORAL | Status: AC | PRN

## 2015-05-01 NOTE — Therapy (Signed)
[facility] [address]  [address] [place], [name], [address] Phone: [phone]   Fax:  [phone]  Physical Therapy Treatment  Patient Details  Name: Jordan Ellison MRN: [id number] Date of Birth: 12/18/1965 Referring Provider:  Lenda KelpHudnall, Shane R, MD  Encounter Date: 05/01/2015      PT End of Session - 05/01/15 0846    Visit Number 2   Number of Visits 12   Date for PT Re-Evaluation 06/05/15   PT Start Time 0801   PT Stop Time 0839   PT Time Calculation (min) 38 min   Activity Tolerance Other (comment)  limited by nausea   Behavior During Therapy [facility] for tasks assessed/performed      Past Medical History  Diagnosis Date  . Depression   . Eating disorder   . Fainting spell   . Hyperlipidemia   . History of UTI   . Cervical cancer screening 02/23/2012  . Anxiety and depression 10/29/2010    Qualifier: Diagnosis of  By: Peggyann Juba'Sullivan FNP, Katrinka BlazingMelissa S   . Insomnia 04/15/2012  . Genital warts 07/06/2013  . Hyperlipidemia 11/22/2010    Qualifier: Diagnosis of  By: Peggyann Juba'Sullivan FNP, Katrinka BlazingMelissa S   . Tinnitus 03/02/2015  . Occipital neuralgia 03/02/2015  . Decreased visual acuity 03/06/2015  . GERD (gastroesophageal reflux disease) 10/29/2010    Qualifier: Diagnosis of  By: Peggyann Juba'Sullivan FNP, Katrinka BlazingMelissa S     Past Surgical History  Procedure Laterality Date  . Total abdominal hysterectomy  07/2005    There were no vitals filed for this visit.  Visit Diagnosis:  Neck pain  Benign paroxysmal positional vertigo, bilateral      Subjective Assessment - 05/01/15 0803    Subjective Neck is still tight today; not sure if she is doing exercises correctly.  Getting dizziness when lying straight back or bending forward; occasionally gets it with rolling over.   Patient Stated Goals feel better   Pain Score --  no pain, just neck stiffness                Vestibular Assessment - 05/01/15 0804    Symptom Behavior   Type of  Dizziness Spinning  "like a rush"   Frequency of Dizziness every time with lying back or looking up   Duration of Dizziness few seconds   Aggravating Factors Looking up to the ceiling;Lying supine;Sitting with head tilted back;Rolling to right;Rolling to left;Forward bending   Relieving Factors Head stationary;Comments  reposition out of provoking positions   Occulomotor Exam   Smooth Pursuits Intact   Saccades Intact   Vestibulo-Occular Reflex   VOR 1 Head Only (x 1 viewing) WNL   Comment bil head thrust negative; mild increase in symptoms   Positional Testing   Dix-Hallpike Dix-Hallpike Right;Dix-Hallpike Left   Dix-Hallpike Right   Dix-Hallpike Right Duration unable to tolerate position   Dix-Hallpike Right Symptoms Upbeat, right rotatory nystagmus   Dix-Hallpike Left   Dix-Hallpike Left Duration 7-8 sec   Dix-Hallpike Left Symptoms Upbeat, left rotatory nystagmus   Cognition   Cognition Orientation Level Appropriate for developmental age                  Vestibular Treatment/Exercise - 05/01/15 0001    Vestibular Treatment/Exercise   Vestibular Treatment Provided Canalith Repositioning   Canalith Repositioning Epley Manuever Left    EPLEY MANUEVER LEFT   Number of Reps  1   Overall Response  No change    RESPONSE  DETAILS LEFT pt with limited tolerance to L epley's; anticipate improved symptoms but unable to perform 2nd repetition.  Requested nausea medication from MD to allow pt to better tolerate repositioning               PT Education - 05/01/15 0846    Education provided Yes   Education Details BPPV, canalith repositioning   Person(s) Educated Patient   Methods Explanation;Handout;Demonstration   Comprehension Verbalized understanding          PT Short Term Goals - 05/01/15 0851    PT SHORT TERM GOAL #1   Title independent with hep for neck stretching by 05/01/15   Status On-going   PT SHORT TERM GOAL #2   Title pt assess for vertigo and  LTG written as appropriate by 05/04/15   Status Achieved           PT Long Term Goals - 05/01/15 0851    PT LONG TERM GOAL #1   Title pt independent with advanced HEP as necessary by 06/05/15   Status On-going   PT LONG TERM GOAL #2   Title c-spine AROM B rotation 70dg or better and B SB without pain by 06/05/15   Status On-going   PT LONG TERM GOAL #3   Title pt denies noting cerviogenic headache for at least 2 weeks by 06/05/15   Status On-going   PT LONG TERM GOAL #4   Title demonstrate negative positional testing (05/29/15)   Status New               Plan - 05/01/15 0849    Clinical Impression Statement Pt demonstrates 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  Pt with increased nausea and limited tolerance to repositioning maneuvers.  L epleys x 1 performed today.  Requested nausea medicated from MD to allow pt to tolerate repositioning.   PT Next Visit Plan continue per POC/neck, vertigo        Problem List Patient Active Problem List   Diagnosis Date Noted  . Neck pain 03/14/2015  . Decreased visual acuity 03/06/2015  . Tachycardia 03/06/2015  . Atypical chest pain 03/06/2015  . Pain in joint, shoulder region 03/06/2015  . Tinnitus 03/02/2015  . Occipital neuralgia 03/02/2015  . Other fatigue 10/01/2014  . Subacute nonsuppurative otitis media of both ears 10/01/2014  . Elevated liver function tests 10/01/2014  . Preventative health care 10/01/2014  . Genital warts 07/06/2013  . Insomnia 04/15/2012  . Cervical cancer screening 02/23/2012  . Abdominal bloating 04/22/2011  . Chronic fatigue 04/22/2011  . Hyperlipidemia 11/22/2010  . ENDOMETRIOSIS 11/22/2010  . Hyperglycemia 11/22/2010  . Constipation 11/05/2010  . RECTAL BLEEDING 11/05/2010  . Anxiety and depression 10/29/2010  . RECTAL FISSURE 10/29/2010  . MENOPAUSE, SURGICAL 10/29/2010  . LEG CRAMPS 10/29/2010  . GERD (gastroesophageal reflux disease) 10/29/2010   Clarita CraneStephanie F  Lova Urbieta, PT, DPT 05/01/2015 8:53 AM  [facility] [address]  [address] [place], [name], [address] Phone: [phone]   Fax:  [phone]

## 2015-05-01 NOTE — Addendum Note (Signed)
Addended by: Lenda KelpHUDNALL, Croix Presley R on: 05/01/2015 02:20 PM   Modules accepted: Orders, SmartSet

## 2015-05-03 ENCOUNTER — Ambulatory Visit: Payer: 59 | Admitting: Physical Therapy

## 2015-05-03 ENCOUNTER — Encounter: Payer: 59 | Admitting: Gastroenterology

## 2015-05-03 DIAGNOSIS — M542 Cervicalgia: Secondary | ICD-10-CM

## 2015-05-03 NOTE — Therapy (Signed)
[facility], [facility] [place] [address]  [address] [place], [name], [address] Phone: [phone]   Fax:  [phone]  Physical Therapy Treatment  Patient Details  Name: Jordan MullerDebora S Manganello MRN: [id number] Date of Birth: 1966/06/27 Referring Provider:  Lenda KelpHudnall, Shane R, MD  Encounter Date: 05/03/2015      PT End of Session - 05/03/15 0813    Visit Number 3   Number of Visits 12   Date for PT Re-Evaluation 06/05/15   PT Start Time 0808   PT Stop Time 0856   PT Time Calculation (min) 48 min      Past Medical History  Diagnosis Date  . Depression   . Eating disorder   . Fainting spell   . Hyperlipidemia   . History of UTI   . Cervical cancer screening 02/23/2012  . Anxiety and depression 10/29/2010    Qualifier: Diagnosis of  By: Peggyann Juba'Sullivan FNP, Katrinka BlazingMelissa S   . Insomnia 04/15/2012  . Genital warts 07/06/2013  . Hyperlipidemia 11/22/2010    Qualifier: Diagnosis of  By: Peggyann Juba'Sullivan FNP, Katrinka BlazingMelissa S   . Tinnitus 03/02/2015  . Occipital neuralgia 03/02/2015  . Decreased visual acuity 03/06/2015  . GERD (gastroesophageal reflux disease) 10/29/2010    Qualifier: Diagnosis of  By: Peggyann Juba'Sullivan FNP, Katrinka BlazingMelissa S     Past Surgical History  Procedure Laterality Date  . Total abdominal hysterectomy  07/2005    There were no vitals filed for this visit.  Visit Diagnosis:  Neck pain      Subjective Assessment - 05/03/15 0810    Subjective States felt nauseous and sore all day after last treatment.  Today's cc = neck stiffness.  States has not been able to perform HEP since last treatment.   Currently in Pain? No/denies       TODAY'S TREATMENT: TherEx - UBE lvl 1.0, 1'/1' Corner pec stretch with chin tuck 3x20" Seated UT stretch 3x20" Low Row 20# 10x, 25# 10x Standing back to wall with Foam Roll along spine: B Horiz ABD Red TB 15x, Pullover with Red TB and B Horiz ABD isometric 10x, B ER Red TB 15x TRX Low Row (focus on maintaining upright posture)  10x  IFC (80-150Hz ) with MHP to c-spine seated x 15'           PT Short Term Goals - 05/03/15 0847    PT SHORT TERM GOAL #1   Title independent with hep for neck stretching by 05/01/15   Status Achieved           PT Long Term Goals - 05/01/15 0851    PT LONG TERM GOAL #1   Title pt independent with advanced HEP as necessary by 06/05/15   Status On-going   PT LONG TERM GOAL #2   Title c-spine AROM B rotation 70dg or better and B SB without pain by 06/05/15   Status On-going   PT LONG TERM GOAL #3   Title pt denies noting cerviogenic headache for at least 2 weeks by 06/05/15   Status On-going   PT LONG TERM GOAL #4   Title demonstrate negative positional testing (05/29/15)   Status New               Plan - 05/03/15 0849    Clinical Impression Statement tolerated today's treatment well but limited variety of exercise due to inability to achieve supine due to BPPV.  overall cc = vertigo and is anxious about being able to improve this.  PT Next Visit Plan continue per POC/neck, vertigo   Consulted and Agree with Plan of Care Patient        Problem List Patient Active Problem List   Diagnosis Date Noted  . Neck pain 03/14/2015  . Decreased visual acuity 03/06/2015  . Tachycardia 03/06/2015  . Atypical chest pain 03/06/2015  . Pain in joint, shoulder region 03/06/2015  . Tinnitus 03/02/2015  . Occipital neuralgia 03/02/2015  . Other fatigue 10/01/2014  . Subacute nonsuppurative otitis media of both ears 10/01/2014  . Elevated liver function tests 10/01/2014  . Preventative health care 10/01/2014  . Genital warts 07/06/2013  . Insomnia 04/15/2012  . Cervical cancer screening 02/23/2012  . Abdominal bloating 04/22/2011  . Chronic fatigue 04/22/2011  . Hyperlipidemia 11/22/2010  . ENDOMETRIOSIS 11/22/2010  . Hyperglycemia 11/22/2010  . Constipation 11/05/2010  . RECTAL BLEEDING 11/05/2010  . Anxiety and depression 10/29/2010  . RECTAL FISSURE 10/29/2010   . MENOPAUSE, SURGICAL 10/29/2010  . LEG CRAMPS 10/29/2010  . GERD (gastroesophageal reflux disease) 10/29/2010    Lilie Vezina PT, OCS 05/03/2015, 8:50 AM  [facility] [place] [address]  [address] [place], [name], [address] Phone: [phone]   Fax:  [phone]

## 2015-05-07 ENCOUNTER — Ambulatory Visit: Payer: 59 | Admitting: Physical Therapy

## 2015-05-07 DIAGNOSIS — M542 Cervicalgia: Secondary | ICD-10-CM

## 2015-05-07 NOTE — Therapy (Signed)
[name], [name], [address] Phone: [phone]   Fax:  [phone]  Physical Therapy Treatment  Patient Details  Name: Jordan Ellison MRN: [id number] Date of Birth: October 10, 1966 Referring Provider:  Lenda KelpHudnall, Shane R, MD  Encounter Date: 05/07/2015      PT End of Session - 05/07/15 0719    Visit Number 4   Number of Visits 12   Date for PT Re-Evaluation 06/05/15   PT Start Time 0717   PT Stop Time 0800   PT Time Calculation (min) 43 min      Past Medical History  Diagnosis Date  . Depression   . Eating disorder   . Fainting spell   . Hyperlipidemia   . History of UTI   . Cervical cancer screening 02/23/2012  . Anxiety and depression 10/29/2010    Qualifier: Diagnosis of  By: Peggyann Juba'Sullivan FNP, Katrinka BlazingMelissa S   . Insomnia 04/15/2012  . Genital warts 07/06/2013  . Hyperlipidemia 11/22/2010    Qualifier: Diagnosis of  By: Peggyann Juba'Sullivan FNP, Katrinka BlazingMelissa S   . Tinnitus 03/02/2015  . Occipital neuralgia 03/02/2015  . Decreased visual acuity 03/06/2015  . GERD (gastroesophageal reflux disease) 10/29/2010    Qualifier: Diagnosis of  By: Peggyann Juba'Sullivan FNP, Katrinka BlazingMelissa S     Past Surgical History  Procedure Laterality Date  . Total abdominal hysterectomy  07/2005    There were no vitals filed for this visit.  Visit Diagnosis:  Neck pain      Subjective Assessment - 05/07/15 0719    Subjective Walked some over the weekend and noted some lower back pain but otherwise no significant pain.  States vertigo seems a little less since working on this with PT last week.  No headaches lately.   Currently in Pain? No/denies  just feel stiff this morning           TODAY'S TREATMENT: TherEx - UBE lvl 1.0, 90"/90" Corner pec stretch with chin tuck 3x20" Seated UT stretch 3x20" Low Row 25# 2x15,10 TRX Low Row (focus on maintaining upright posture) 10x (states began to note headache so stopped) Standing back to  wall with Foam Roll along spine: B Shoulder Flexion 2# db 10x, B Horiz ABD Green TB 10x, B Shoulder ABD 2# 10x, B ER Green TB 15x  IFC (80-150Hz ) with MHP to c-spine seated x 15'          PT Short Term Goals - 05/03/15 0847    PT SHORT TERM GOAL #1   Title independent with hep for neck stretching by 05/01/15   Status Achieved           PT Long Term Goals - 05/07/15 0746    PT LONG TERM GOAL #1   Title pt independent with advanced HEP as necessary by 06/05/15   Status On-going   PT LONG TERM GOAL #2   Title c-spine AROM B rotation 70dg or better and B SB without pain by 06/05/15   Status On-going   PT LONG TERM GOAL #3   Title pt denies noting cerviogenic headache for at least 2 weeks by 06/05/15   Status On-going   PT LONG TERM GOAL #4   Title demonstrate negative positional testing (05/29/15)   Status On-going               Plan - 05/07/15 0747    Clinical Impression Statement states has been feeling good at start of treatment but then  c/o feeling tired once began to exercise and stated noted sensation as though was getting headache while performing TRX low row but states felt normal within a few minutes of stopping exercise.  Overall, pt seems to tolerate treatment well but limited exercise/activity tolerance which may be related to neck or may be related to sedentary lifestyle.   PT Next Visit Plan continue per POC/neck, vertigo   Consulted and Agree with Plan of Care Patient        Problem List Patient Active Problem List   Diagnosis Date Noted  . Neck pain 03/14/2015  . Decreased visual acuity 03/06/2015  . Tachycardia 03/06/2015  . Atypical chest pain 03/06/2015  . Pain in joint, shoulder region 03/06/2015  . Tinnitus 03/02/2015  . Occipital neuralgia 03/02/2015  . Other fatigue 10/01/2014  . Subacute nonsuppurative otitis media of both ears 10/01/2014  . Elevated liver function tests 10/01/2014  . Preventative health care 10/01/2014  . Genital warts  07/06/2013  . Insomnia 04/15/2012  . Cervical cancer screening 02/23/2012  . Abdominal bloating 04/22/2011  . Chronic fatigue 04/22/2011  . Hyperlipidemia 11/22/2010  . ENDOMETRIOSIS 11/22/2010  . Hyperglycemia 11/22/2010  . Constipation 11/05/2010  . RECTAL BLEEDING 11/05/2010  . Anxiety and depression 10/29/2010  . RECTAL FISSURE 10/29/2010  . MENOPAUSE, SURGICAL 10/29/2010  . LEG CRAMPS 10/29/2010  . GERD (gastroesophageal reflux disease) 10/29/2010    Jordan Ellison PT, OCS 05/07/2015, 8:04 AM  [facility] [address]  [address] [place], [place], [address] Phone: [phone]   Fax:  [phone]

## 2015-05-08 ENCOUNTER — Ambulatory Visit: Payer: 59 | Admitting: Family Medicine

## 2015-05-08 ENCOUNTER — Ambulatory Visit: Payer: 59

## 2015-05-10 ENCOUNTER — Ambulatory Visit: Payer: 59 | Admitting: Physical Therapy

## 2015-05-10 DIAGNOSIS — M542 Cervicalgia: Secondary | ICD-10-CM | POA: Diagnosis not present

## 2015-05-10 DIAGNOSIS — H8113 Benign paroxysmal vertigo, bilateral: Secondary | ICD-10-CM

## 2015-05-10 NOTE — Therapy (Addendum)
[facility] [place] [address]  [place], [place], [address] Phone: [phone]   Fax:  [phone]  Physical Therapy Treatment  Patient Details  Name: Jordan Ellison MRN: [id number] Date of Birth: [birth year] Referring Provider:  Dene Gentry, MD  Encounter Date: 05/10/2015      PT End of Session - 05/10/15 0956    Visit Number 5   Number of Visits 12   Date for PT Re-Evaluation 06/05/15   PT Start Time 0845   PT Stop Time 0923   PT Time Calculation (min) 38 min   Activity Tolerance Other (comment)  limited by nausea   Behavior During Therapy [facility] for tasks assessed/performed      Past Medical History  Diagnosis Date  . Depression   . Eating disorder   . Fainting spell   . Hyperlipidemia   . History of UTI   . Cervical cancer screening 02/23/2012  . Anxiety and depression 10/29/2010    Qualifier: Diagnosis of  By: Inda Castle FNP, [place]   . Insomnia 04/15/2012  . Genital warts 07/06/2013  . Hyperlipidemia 11/22/2010    Qualifier: Diagnosis of  By: Inda Castle FNP, Wellington [place]   . Tinnitus 03/02/2015  . Occipital neuralgia 03/02/2015  . Decreased visual acuity 03/06/2015  . GERD (gastroesophageal reflux disease) 10/29/2010    Qualifier: Diagnosis of  By: Inda Castle FNP, Wellington Hampshire     Past Surgical History  Procedure Laterality Date  . Total abdominal hysterectomy  07/2005    There were no vitals filed for this visit.  Visit Diagnosis:  Benign paroxysmal positional vertigo, bilateral      Subjective Assessment - 05/10/15 0848    Subjective feeling a little dizzy but better.  got nausea medication.                Vestibular Assessment - 05/10/15 0001    Dix-Hallpike Right   Dix-Hallpike Right Duration unable to tolerate position   Dix-Hallpike Right Symptoms Upbeat, right rotatory nystagmus   Dix-Hallpike Left   Dix-Hallpike Left Duration 7-8 sec; decreased intensity   Dix-Hallpike Left  Symptoms Upbeat, left rotatory nystagmus                  Vestibular Treatment/Exercise - 05/10/15 0001    Vestibular Treatment/Exercise   Vestibular Treatment Provided Canalith Repositioning   Canalith Repositioning Epley Manuever Left   Habituation Exercises Brandt Daroff    EPLEY MANUEVER LEFT   Number of Reps  1   Overall Response  Improved Symptoms  from previous session    RESPONSE DETAILS LEFT pt able to tolerate L epley's better   Nestor Lewandowsky   Symptom Description  attempted Brandt-Daroff at pt's request for home exercises to help with BPPV.  Pt unable to tolerate R sidelying and + emesis upon returning to sitting.                 PT Education - 05/10/15 0955    Education provided Yes   Education Details brandt-daroff; call MD and ask if Zofran dose can be increased   Person(s) Educated Patient   Methods Explanation;Handout   Comprehension Verbalized understanding          PT Short Term Goals - 05/03/15 0847    PT SHORT TERM GOAL #1   Title independent with hep for neck stretching by 05/01/15   Status Achieved           PT Long Term Goals -  05/07/15 0746    PT LONG TERM GOAL #1   Title pt independent with advanced HEP as necessary by 06/05/15   Status On-going   PT LONG TERM GOAL #2   Title c-spine AROM B rotation 70dg or better and B SB without pain by 06/05/15   Status On-going   PT LONG TERM GOAL #3   Title pt denies noting cerviogenic headache for at least 2 weeks by 06/05/15   Status On-going   PT LONG TERM GOAL #4   Title demonstrate negative positional testing (05/29/15)   Status On-going               Plan - 05/10/15 0956    Clinical Impression Statement Pt with R posterior canal BPPV but symptoms severe and pt unable to tolerate position for perfrom canalith repositioning.  Feel if pt can tolerate repositioning x 1-2 reps vertigo will likley be cleared.  Recommended calling MD to see if she can increase dose of nausea  meds.     PT Next Visit Plan continue per POC/neck, vertigo (R epley's first next session)   Consulted and Agree with Plan of Care Patient        Problem List Patient Active Problem List   Diagnosis Date Noted  . Neck pain 03/14/2015  . Decreased visual acuity 03/06/2015  . Tachycardia 03/06/2015  . Atypical chest pain 03/06/2015  . Pain in joint, shoulder region 03/06/2015  . Tinnitus 03/02/2015  . Occipital neuralgia 03/02/2015  . Other fatigue 10/01/2014  . Subacute nonsuppurative otitis media of both ears 10/01/2014  . Elevated liver function tests 10/01/2014  . Preventative health care 10/01/2014  . Genital warts 07/06/2013  . Insomnia 04/15/2012  . Cervical cancer screening 02/23/2012  . Abdominal bloating 04/22/2011  . Chronic fatigue 04/22/2011  . Hyperlipidemia 11/22/2010  . ENDOMETRIOSIS 11/22/2010  . Hyperglycemia 11/22/2010  . Constipation 11/05/2010  . RECTAL BLEEDING 11/05/2010  . Anxiety and depression 10/29/2010  . RECTAL FISSURE 10/29/2010  . MENOPAUSE, SURGICAL 10/29/2010  . LEG CRAMPS 10/29/2010  . GERD (gastroesophageal reflux disease) 10/29/2010   Laureen Abrahams, PT, DPT 05/10/2015 9:58 AM  [facility] [address]  [place], [place], [address] Phone: [phone]   Fax:  [phone]     PHYSICAL THERAPY DISCHARGE SUMMARY  Visits from Start of Care: 5  Current functional level related to goals / functional outcomes: unknown   Remaining deficits: unknown   Education / Equipment: HEP Plan: Patient agrees to discharge.  Patient goals were not met. Patient is being discharged due to not returning since the last visit.  ?????       Pt seen for 5 PT treatments (3 for neck pain and 2 fro BPPV) with last treatment on 05/10/15.  She cancelled appointments scheduled after 05/10/15 and has not rescheduled so is therefore being discharged from our care at this time.  Leonette Most PT, OCS 07/02/2015 8:46 AM

## 2015-05-10 NOTE — Patient Instructions (Signed)
Sit to Side-Lying   Sit on edge of bed. 1. Turn head 45 to left. 2. Maintain head position and lie down slowly on right side. Hold until symptoms subside plus 30 seconds. 3. Sit up slowly. Hold until symptoms subside plus 30 seconds. 4. Turn head 45 to right. 5. Maintain head position and lie down slowly on left side. Hold until symptoms subside plus 30 seconds. 6. Sit up slowly.  Hold until symptoms subside plus 30 seconds.   Repeat sequence __3-5__ times per session. Do _2-3___ sessions per day.  Copyright  VHI. All rights reserved.   Clarita CraneStephanie F Bryn Perkin, PT, DPT 05/10/2015 9:15 AM  [facility] [place] [address] [place], [name] [address]  [phone] (office) [phone] (fax)

## 2015-05-13 NOTE — Progress Notes (Signed)
Cardiology Office Note   Date:  05/13/2015   ID:  GIOVANNINA MUN, DOB 1966/01/17, MRN [id number]  PCP:  Danise Edge, MD  Cardiologist:   Charlton Haws, MD   No chief complaint on file.     History of Present Illness: Jordan Ellison is a 49 y.o. female who presents for atypical chest pain.  Anxiety attacks.  Brother had cardiac arrest last Fall CRF elevated lipids  A1c 5.9  Lab Results  Component Value Date   LDLCALC 129* 03/02/2015  For a year indicates almost constant central chest pressure.  Use to have a lot of epigastric pain and it seems to have gone to chest.  Not exertional not associated With nausea, belching or vohmiting  Denies history of IBS.  Gets tightness every day and complained of pain during visit with no acute ECG changes.  Not pleuritic Nothing makes it better.  Brother sees Dr Shirlee Latch for severe 2VD with cardiac arrest.  She admits to some anxiety about her brothers diagnosis and fact that he had sudden death at work.      Past Medical History  Diagnosis Date  . Depression   . Eating disorder   . Fainting spell   . Hyperlipidemia   . History of UTI   . Cervical cancer screening 02/23/2012  . Anxiety and depression 10/29/2010    Qualifier: Diagnosis of  By: Peggyann Juba FNP, Katrinka Blazing   . Insomnia 04/15/2012  . Genital warts 07/06/2013  . Hyperlipidemia 11/22/2010    Qualifier: Diagnosis of  By: Peggyann Juba FNP, Katrinka Blazing   . Tinnitus 03/02/2015  . Occipital neuralgia 03/02/2015  . Decreased visual acuity 03/06/2015  . GERD (gastroesophageal reflux disease) 10/29/2010    Qualifier: Diagnosis of  By: Peggyann Juba FNP, Katrinka Blazing     Past Surgical History  Procedure Laterality Date  . Total abdominal hysterectomy  07/2005     Current Outpatient Prescriptions  Medication Sig Dispense Refill  . ALPRAZolam (XANAX) 0.25 MG tablet Take 1 tablet (0.25 mg total) by mouth 2 (two) times daily as needed for anxiety. 40 tablet 0  . aspirin 81 MG tablet Take 81 mg by  mouth daily.    Marland Kitchen FOLIC ACID PO Take 600 mg by mouth daily.     Marland Kitchen MAGNESIUM PO Take 400 mg by mouth 2 (two) times daily.     . ondansetron (ZOFRAN) 4 MG tablet Take 1 tablet (4 mg total) by mouth every 8 (eight) hours as needed for nausea or vomiting. 40 tablet 1  . polyethylene glycol (MIRALAX / GLYCOLAX) packet Take 17 g by mouth daily.      . Probiotic Product (TRUNATURE DIGESTIVE PROBIOTIC) CAPS Take by mouth daily.    . ranitidine (ZANTAC) 150 MG tablet Take 1 tablet (150 mg total) by mouth 2 (two) times daily as needed for heartburn. 180 tablet 3   No current facility-administered medications for this visit.    Allergies:   Iodine; Sulfonamide derivatives; and Tylenol with codeine #3    Social History:  The patient  reports that she has quit smoking. She does not have any smokeless tobacco history on file. She reports that she drinks alcohol. She reports that she does not use illicit drugs.   Family History:  The patient's family history includes COPD in her sister; Cancer in her mother; Diabetes in her brother and sister; GER disease in her brother; Heart attack in her father; Heart disease in her father, mother, and sister; Hemochromatosis in  her brother; Hyperlipidemia in her brother, brother, brother, father, mother, sister, and sister; Hypertension in her brother, mother, and sister; Iron deficiency in her brother; Kidney failure in her mother; Obesity in her sister and sister; Other in her brother, brother, and sister; Peripheral vascular disease in her brother; Seizures in her brother; Thyroid disease in her brother, mother, and sister; Uterine cancer in her mother.    ROS:  Please see the history of present illness.   Otherwise, review of systems are positive for none.   All other systems are reviewed and negative.    PHYSICAL EXAM: VS:  There were no vitals taken for this visit. , BMI There is no weight on file to calculate BMI. Affect appropriate Healthy:  appears stated  age HEENT: normal Neck supple with no adenopathy JVP normal no bruits no thyromegaly Lungs clear with no wheezing and good diaphragmatic motion Heart:  S1/S2 no murmur, no rub, gallop or click PMI normal Abdomen: benighn, BS positve, no tenderness, no AAA no bruit.  No HSM or HJR Distal pulses intact with no bruits No edema Neuro non-focal Skin warm and dry No muscular weakness    EKG:   SR rate 85  nonspecfic ST changes poor R wave progression    Recent Labs: 03/02/2015: ALT 41*; BUN 15; Creatinine 0.56; Hemoglobin 13.7; Platelets 284; Potassium 4.1; Sodium 139; TSH 2.510    Lipid Panel    Component Value Date/Time   CHOL 200 03/02/2015 1647   TRIG 207* 03/02/2015 1647   HDL 30* 03/02/2015 1647   CHOLHDL 6.7 03/02/2015 1647   VLDL 41* 03/02/2015 1647   LDLCALC 129* 03/02/2015 1647      Wt Readings from Last 3 Encounters:  03/08/15 222 lb (100.699 kg)  03/02/15 222 lb 4 oz (100.812 kg)  09/25/14 228 lb 9.6 oz (103.692 kg)      Other studies Reviewed: Additional studies/ records that were reviewed today include: Epic notes and notes Dr Rogelia RohrerBlythe.    ASSESSMENT AND PLAN:  1.  Chest Pain> very atypical abnormal T waves f/u exercise myovue  Breast artifact may be and issue 2. Anxiety.  Her brothers situation seems to be complicated by gallstone pancreatitis and ETOH  Reassured her that her situation is different PRN xanax per primary    3. Chol:   Diet Rx if myovue normal and no known vascular diseae  4. GI  Continue zantac/zofran ok to proceed with GI w/u    Current medicines are reviewed at length with the patient today.  The patient does not have concerns regarding medicines.  The following changes have been made:  no change  Labs/ tests ordered today include: Exercise myovue   No orders of the defined types were placed in this encounter.     Disposition:   FU with me PRN if stress test normal      Signed, Charlton HawsPeter Bisma Klett, MD  05/13/2015 8:34 PM     [facility] [address], [place], [name]  [address] Phone: [phone]; Fax: [phone]

## 2015-05-15 ENCOUNTER — Ambulatory Visit: Payer: 59

## 2015-05-16 ENCOUNTER — Ambulatory Visit (INDEPENDENT_AMBULATORY_CARE_PROVIDER_SITE_OTHER): Payer: 59 | Admitting: Cardiovascular Disease

## 2015-05-16 ENCOUNTER — Encounter: Payer: Self-pay | Admitting: Cardiovascular Disease

## 2015-05-16 VITALS — BP 100/70 | HR 85 | Ht 64.0 in | Wt 224.0 lb

## 2015-05-16 DIAGNOSIS — R079 Chest pain, unspecified: Secondary | ICD-10-CM

## 2015-05-16 DIAGNOSIS — R9431 Abnormal electrocardiogram [ECG] [EKG]: Secondary | ICD-10-CM

## 2015-05-16 NOTE — Patient Instructions (Signed)
Medication Instructions:  NO CHANGES  Labwork: NONE  Testing/Procedures: Your physician has requested that you have en exercise stress myoview. For further information please visit www.cardiosmart.org. Please follow instruction sheet, as given.   Follow-Up: AS  NEEDED  Any Other Special Instructions Will Be Listed Below (If Applicable).   

## 2015-05-17 ENCOUNTER — Ambulatory Visit: Payer: 59

## 2015-05-18 ENCOUNTER — Encounter: Payer: Self-pay | Admitting: Cardiovascular Disease

## 2015-05-22 ENCOUNTER — Ambulatory Visit: Payer: 59 | Admitting: Family Medicine

## 2015-05-22 ENCOUNTER — Ambulatory Visit: Payer: 59 | Admitting: Physical Therapy

## 2015-05-23 ENCOUNTER — Ambulatory Visit (INDEPENDENT_AMBULATORY_CARE_PROVIDER_SITE_OTHER): Payer: 59 | Admitting: Family Medicine

## 2015-05-23 DIAGNOSIS — H8113 Benign paroxysmal vertigo, bilateral: Secondary | ICD-10-CM

## 2015-05-23 DIAGNOSIS — M542 Cervicalgia: Secondary | ICD-10-CM

## 2015-05-23 MED ORDER — MECLIZINE HCL 32 MG PO TABS: 32.0000 mg | ORAL_TABLET | Freq: Three times a day (TID) | ORAL | Status: DC | PRN

## 2015-05-23 NOTE — Patient Instructions (Signed)
Double up on the zofran before you go to therapy (for nausea). Try meclizine as well for dizziness. Hopefully we can get the vertigo under control so you can continue therapy for the trapezius spasms/strain. Follow up with me in 6 weeks.

## 2015-05-24 ENCOUNTER — Ambulatory Visit: Payer: 59

## 2015-05-24 DIAGNOSIS — H8110 Benign paroxysmal vertigo, unspecified ear: Secondary | ICD-10-CM | POA: Insufficient documentation

## 2015-05-24 NOTE — Progress Notes (Signed)
PCP: Danise Edge, MD  Subjective:   HPI: Patient is a 49 y.o. female here for neck pain.  3/24: Patient reports for over a year she's had tension, pain, knot in right side of neck. Has been worse over past week with associated spasms. Pain can go down shoulder into arm mainly on right side. No numbness or tingling. No bowel/bladder dysfunction.  6/8: Patient struggled quite a bit in physical therapy but more because of BPPV than therapy for her neck.   Would get severe episodes of vertigo, nausea - tried to treat this in PT but she hasn't been able to tolerate it well despite zofran. Does home exercises for neck which has helped at home. No numbness/tingling. No bowel/bladder dysfunction.  Past Medical History  Diagnosis Date  . Depression   . Eating disorder   . Fainting spell   . Hyperlipidemia   . History of UTI   . Cervical cancer screening 02/23/2012  . Anxiety and depression 10/29/2010    Qualifier: Diagnosis of  By: Peggyann Juba FNP, Katrinka Blazing   . Insomnia 04/15/2012  . Genital warts 07/06/2013  . Hyperlipidemia 11/22/2010    Qualifier: Diagnosis of  By: Peggyann Juba FNP, Katrinka Blazing   . Tinnitus 03/02/2015  . Occipital neuralgia 03/02/2015  . Decreased visual acuity 03/06/2015  . GERD (gastroesophageal reflux disease) 10/29/2010    Qualifier: Diagnosis of  By: Peggyann Juba FNP, Katrinka Blazing     Current Outpatient Prescriptions on File Prior to Visit  Medication Sig Dispense Refill  . ALPRAZolam (XANAX) 0.25 MG tablet Take 1 tablet (0.25 mg total) by mouth 2 (two) times daily as needed for anxiety. 40 tablet 0  . aspirin 81 MG tablet Take 81 mg by mouth daily.    Marland Kitchen FOLIC ACID PO Take 600 mg by mouth daily.     Marland Kitchen MAGNESIUM PO Take 400 mg by mouth 2 (two) times daily.     . ondansetron (ZOFRAN) 4 MG tablet Take 1 tablet (4 mg total) by mouth every 8 (eight) hours as needed for nausea or vomiting. 40 tablet 1  . Probiotic Product (TRUNATURE DIGESTIVE PROBIOTIC) CAPS Take by mouth  daily.    . ranitidine (ZANTAC) 150 MG tablet Take 1 tablet (150 mg total) by mouth 2 (two) times daily as needed for heartburn. 180 tablet 3   No current facility-administered medications on file prior to visit.    Past Surgical History  Procedure Laterality Date  . Total abdominal hysterectomy  07/2005    Allergies  Allergen Reactions  . Iodine   . Sulfonamide Derivatives     REACTION: fever, weakness  . Tylenol With Codeine #3 [Acetaminophen-Codeine]     History   Social History  . Marital Status: Single    Spouse Name: N/A  . Number of Children: N/A  . Years of Education: N/A   Occupational History  . Not on file.   Social History Main Topics  . Smoking status: Former Games developer  . Smokeless tobacco: Not on file     Comment: quit smoking in 2000  . Alcohol Use: 0.0 oz/week    0 Standard drinks or equivalent per week     Comment: occasional glass of wine  . Drug Use: No  . Sexual Activity: No   Other Topics Concern  . 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 Uterine cancer Mother   . Kidney failure Mother   . Cancer Mother     kidney, uterine  . Heart disease Mother   . Hypertension Mother   . Hyperlipidemia Mother   . Thyroid disease Mother     likely hyperactive  . Heart attack Father     deceased in 20  . Heart disease Father     MI  . Hyperlipidemia Father   . COPD Sister     smoker  . Other Sister     2 stents  . Heart disease Sister   . Hyperlipidemia Sister   . Obesity Sister   . Hypertension Sister   . Hyperlipidemia Sister   . Obesity Sister   . Thyroid disease Sister     hypothyroid  . Diabetes Sister     type 2- diet controlled   . GER disease Brother   . Hyperlipidemia Brother   . Hypertension Brother   . Hyperlipidemia Brother   . Iron deficiency Brother   . Other Brother     pigment issue/ poor circulation  . Hyperlipidemia Brother   . Hemochromatosis Brother   . Peripheral vascular disease Brother   . Seizures Brother   . Other Brother     Epilepsy  . Thyroid disease Brother     hyper  . Diabetes Brother     type 2- diet controlled    There were no vitals taken for this visit.  Review of Systems: See HPI above.    Objective:  Physical Exam:  Gen: NAD  Neck: No gross deformity, swelling, bruising. TTP mildly right paraspinal region and just medial to medial scapular border.  No midline/bony TTP. FROM. BUE strength 5/5.   Sensation intact to light touch.   2+ equal reflexes in triceps, biceps, brachioradialis tendons. NV intact distal BUEs.    Assessment & Plan:  1. Neck pain - with some radiation into right arm at times.  Some improvement with the little PT she's been able to do and home exercises.  Focus is now on BPPV then will restart PT for neck.  F/u in 6 weeks.  2. BPPV - advised she can double her zofran and prescribed meclizine to take while she does HEP, PT for the vertigo.  Hopefully we can get this under control so she can focus on PT for the neck.

## 2015-05-24 NOTE — Assessment & Plan Note (Signed)
advised she can double her zofran and prescribed meclizine to take while she does HEP, PT for the vertigo.  Hopefully we can get this under control so she can focus on PT for the neck.

## 2015-05-24 NOTE — Assessment & Plan Note (Signed)
with some radiation into right arm at times.  Some improvement with the little PT she's been able to do and home exercises.  Focus is now on BPPV then will restart PT for neck.  F/u in 6 weeks.

## 2015-05-25 ENCOUNTER — Ambulatory Visit: Payer: 59 | Admitting: Family Medicine

## 2015-05-28 ENCOUNTER — Telehealth (HOSPITAL_COMMUNITY): Payer: Self-pay | Admitting: *Deleted

## 2015-05-28 MED ORDER — MECLIZINE HCL 25 MG PO TABS: 25.0000 mg | ORAL_TABLET | Freq: Three times a day (TID) | ORAL | Status: AC | PRN

## 2015-05-28 NOTE — Telephone Encounter (Signed)
Left message on voicemail in reference to upcoming appointment scheduled for 05/30/15. Phone number given for a call back so details instructions can be given. Jordan Ellison W  

## 2015-05-28 NOTE — Addendum Note (Signed)
Addended by: Lenda Kelp on: 05/28/2015 08:35 AM   Modules accepted: Orders

## 2015-05-29 ENCOUNTER — Telehealth (HOSPITAL_COMMUNITY): Payer: Self-pay | Admitting: *Deleted

## 2015-05-29 NOTE — Telephone Encounter (Signed)
Left message on voicemail in reference to upcoming appointment scheduled for 05/30/15. Phone number given for a call back so details instructions can be given. Jordan Ellison W  

## 2015-05-30 ENCOUNTER — Ambulatory Visit (HOSPITAL_COMMUNITY): Payer: 59 | Attending: Cardiology

## 2015-05-30 ENCOUNTER — Encounter (HOSPITAL_COMMUNITY): Payer: 59

## 2015-05-30 DIAGNOSIS — R079 Chest pain, unspecified: Secondary | ICD-10-CM | POA: Insufficient documentation

## 2015-05-30 DIAGNOSIS — R9431 Abnormal electrocardiogram [ECG] [EKG]: Secondary | ICD-10-CM | POA: Diagnosis not present

## 2015-05-30 LAB — MYOCARDIAL PERFUSION IMAGING
CHL CUP NUCLEAR SRS: 0
CHL CUP NUCLEAR SSS: 1
CSEPED: 10 min
CSEPEDS: 0 s
CSEPPHR: 153 {beats}/min
Estimated workload: 10.1 METS
LV dias vol: 64 mL
LVSYSVOL: 18 mL
MPHR: 170 {beats}/min
Nuc Stress EF: 71 %
Percent HR: 90 %
RATE: 0.29
RPE: 18
Rest HR: 68 {beats}/min
SDS: 1
TID: 0.83

## 2015-05-30 MED ORDER — TECHNETIUM TC 99M SESTAMIBI GENERIC - CARDIOLITE
11.0000 | Freq: Once | INTRAVENOUS | Status: AC | PRN
  Administered 2015-05-30: 11 via INTRAVENOUS

## 2015-05-30 MED ORDER — TECHNETIUM TC 99M SESTAMIBI GENERIC - CARDIOLITE
33.0000 | Freq: Once | INTRAVENOUS | Status: AC | PRN
  Administered 2015-05-30: 33 via INTRAVENOUS

## 2015-06-04 ENCOUNTER — Telehealth: Payer: Self-pay | Admitting: Cardiovascular Disease

## 2015-06-04 ENCOUNTER — Other Ambulatory Visit: Payer: Self-pay | Admitting: *Deleted

## 2015-06-04 DIAGNOSIS — R000 Tachycardia, unspecified: Secondary | ICD-10-CM

## 2015-06-04 NOTE — Telephone Encounter (Signed)
New Message      Pt is calling in to return call

## 2015-06-04 NOTE — Telephone Encounter (Signed)
PT AWARE OF MYOVIEW RESULTS./CY 

## 2015-06-04 NOTE — Addendum Note (Signed)
Addended by: Scherrie Bateman E on: 06/04/2015 03:25 PM   Modules accepted: Orders

## 2015-07-05 ENCOUNTER — Ambulatory Visit: Payer: 59 | Admitting: Family Medicine

## 2016-07-07 NOTE — Progress Notes (Signed)
Erroneous encounter

## 2016-10-11 ENCOUNTER — Observation Stay (HOSPITAL_BASED_OUTPATIENT_CLINIC_OR_DEPARTMENT_OTHER)
Admission: EM | Admit: 2016-10-11 | Discharge: 2016-10-13 | Disposition: A | Discharge status: Home / Self Care | Payer: BLUE CROSS/BLUE SHIELD | Attending: General Surgery | Admitting: General Surgery

## 2016-10-11 ENCOUNTER — Encounter (HOSPITAL_BASED_OUTPATIENT_CLINIC_OR_DEPARTMENT_OTHER): Payer: Self-pay | Admitting: Adult Health

## 2016-10-11 ENCOUNTER — Emergency Department (HOSPITAL_BASED_OUTPATIENT_CLINIC_OR_DEPARTMENT_OTHER): Payer: BLUE CROSS/BLUE SHIELD

## 2016-10-11 DIAGNOSIS — K219 Gastro-esophageal reflux disease without esophagitis: Secondary | ICD-10-CM | POA: Diagnosis not present

## 2016-10-11 DIAGNOSIS — F419 Anxiety disorder, unspecified: Secondary | ICD-10-CM | POA: Diagnosis not present

## 2016-10-11 DIAGNOSIS — A419 Sepsis, unspecified organism: Secondary | ICD-10-CM | POA: Diagnosis present

## 2016-10-11 DIAGNOSIS — Z87891 Personal history of nicotine dependence: Secondary | ICD-10-CM | POA: Insufficient documentation

## 2016-10-11 DIAGNOSIS — Z888 Allergy status to other drugs, medicaments and biological substances status: Secondary | ICD-10-CM | POA: Diagnosis not present

## 2016-10-11 DIAGNOSIS — A630 Anogenital (venereal) warts: Secondary | ICD-10-CM | POA: Insufficient documentation

## 2016-10-11 DIAGNOSIS — Z8049 Family history of malignant neoplasm of other genital organs: Secondary | ICD-10-CM | POA: Insufficient documentation

## 2016-10-11 DIAGNOSIS — L0231 Cutaneous abscess of buttock: Secondary | ICD-10-CM | POA: Insufficient documentation

## 2016-10-11 DIAGNOSIS — K613 Ischiorectal abscess: Secondary | ICD-10-CM | POA: Diagnosis not present

## 2016-10-11 DIAGNOSIS — M5481 Occipital neuralgia: Secondary | ICD-10-CM | POA: Diagnosis not present

## 2016-10-11 DIAGNOSIS — Z833 Family history of diabetes mellitus: Secondary | ICD-10-CM | POA: Insufficient documentation

## 2016-10-11 DIAGNOSIS — K611 Rectal abscess: Secondary | ICD-10-CM | POA: Diagnosis present

## 2016-10-11 DIAGNOSIS — F329 Major depressive disorder, single episode, unspecified: Secondary | ICD-10-CM | POA: Diagnosis not present

## 2016-10-11 DIAGNOSIS — E785 Hyperlipidemia, unspecified: Secondary | ICD-10-CM | POA: Insufficient documentation

## 2016-10-11 DIAGNOSIS — G4700 Insomnia, unspecified: Secondary | ICD-10-CM | POA: Diagnosis not present

## 2016-10-11 DIAGNOSIS — Z820 Family history of epilepsy and other diseases of the nervous system: Secondary | ICD-10-CM | POA: Insufficient documentation

## 2016-10-11 DIAGNOSIS — Z90710 Acquired absence of both cervix and uterus: Secondary | ICD-10-CM | POA: Diagnosis not present

## 2016-10-11 DIAGNOSIS — Z6841 Body Mass Index (BMI) 40.0 and over, adult: Secondary | ICD-10-CM | POA: Diagnosis not present

## 2016-10-11 DIAGNOSIS — Z825 Family history of asthma and other chronic lower respiratory diseases: Secondary | ICD-10-CM | POA: Diagnosis not present

## 2016-10-11 DIAGNOSIS — Z885 Allergy status to narcotic agent status: Secondary | ICD-10-CM | POA: Insufficient documentation

## 2016-10-11 DIAGNOSIS — Z8051 Family history of malignant neoplasm of kidney: Secondary | ICD-10-CM | POA: Diagnosis not present

## 2016-10-11 DIAGNOSIS — Z8349 Family history of other endocrine, nutritional and metabolic diseases: Secondary | ICD-10-CM | POA: Diagnosis not present

## 2016-10-11 DIAGNOSIS — Z87440 Personal history of urinary (tract) infections: Secondary | ICD-10-CM | POA: Diagnosis not present

## 2016-10-11 DIAGNOSIS — Z882 Allergy status to sulfonamides status: Secondary | ICD-10-CM | POA: Insufficient documentation

## 2016-10-11 DIAGNOSIS — Z79899 Other long term (current) drug therapy: Secondary | ICD-10-CM | POA: Insufficient documentation

## 2016-10-11 DIAGNOSIS — Z8249 Family history of ischemic heart disease and other diseases of the circulatory system: Secondary | ICD-10-CM | POA: Insufficient documentation

## 2016-10-11 DIAGNOSIS — L03317 Cellulitis of buttock: Secondary | ICD-10-CM

## 2016-10-11 LAB — CBC WITH DIFFERENTIAL/PLATELET
BASOS ABS: 0 10*3/uL (ref 0.0–0.1)
Band Neutrophils: 2 %
Basophils Relative: 0 %
EOS ABS: 0.2 10*3/uL (ref 0.0–0.7)
Eosinophils Relative: 1 %
HEMATOCRIT: 39.7 % (ref 36.0–46.0)
HEMOGLOBIN: 13.3 g/dL (ref 12.0–15.0)
LYMPHS PCT: 11 %
Lymphs Abs: 1.7 10*3/uL (ref 0.7–4.0)
MCH: 32.3 pg (ref 26.0–34.0)
MCHC: 33.5 g/dL (ref 30.0–36.0)
MCV: 96.4 fL (ref 78.0–100.0)
MONOS PCT: 11 %
Monocytes Absolute: 1.7 10*3/uL — ABNORMAL HIGH (ref 0.1–1.0)
NEUTROS PCT: 75 %
Neutro Abs: 12.3 10*3/uL — ABNORMAL HIGH (ref 1.7–7.7)
Platelets: 236 10*3/uL (ref 150–400)
RBC: 4.12 MIL/uL (ref 3.87–5.11)
RDW: 12.4 % (ref 11.5–15.5)
WBC: 15.9 10*3/uL — AB (ref 4.0–10.5)

## 2016-10-11 LAB — BASIC METABOLIC PANEL
ANION GAP: 9 (ref 5–15)
BUN: 15 mg/dL (ref 6–20)
CHLORIDE: 104 mmol/L (ref 101–111)
CO2: 24 mmol/L (ref 22–32)
Calcium: 8.8 mg/dL — ABNORMAL LOW (ref 8.9–10.3)
Creatinine, Ser: 0.85 mg/dL (ref 0.44–1.00)
GFR calc non Af Amer: >60 mL/min (ref >60)
Glucose, Bld: 117 mg/dL — ABNORMAL HIGH (ref 65–99)
Potassium: 3.9 mmol/L (ref 3.5–5.1)
Sodium: 137 mmol/L (ref 135–145)

## 2016-10-11 LAB — I-STAT CG4 LACTIC ACID, ED: LACTIC ACID, VENOUS: 1.57 mmol/L (ref 0.5–1.9)

## 2016-10-11 MED ORDER — SODIUM CHLORIDE 0.9 % IV BOLUS (SEPSIS)
1000.0000 mL | Freq: Once | INTRAVENOUS | Status: AC
  Administered 2016-10-11: 1000 mL via INTRAVENOUS

## 2016-10-11 MED ORDER — ACETAMINOPHEN 325 MG PO TABS
ORAL_TABLET | ORAL | Status: AC
  Filled 2016-10-11: qty 2

## 2016-10-11 MED ORDER — VANCOMYCIN HCL 10 G IV SOLR
2000.0000 mg | Freq: Once | INTRAVENOUS | Status: DC
  Filled 2016-10-11: qty 2000

## 2016-10-11 MED ORDER — PIPERACILLIN-TAZOBACTAM 3.375 G IVPB 30 MIN
3.3750 g | Freq: Once | INTRAVENOUS | Status: AC
  Administered 2016-10-11: 3.375 g via INTRAVENOUS
  Filled 2016-10-11 (×2): qty 50

## 2016-10-11 MED ORDER — VANCOMYCIN HCL IN DEXTROSE 1-5 GM/200ML-% IV SOLN
INTRAVENOUS | Status: AC
  Filled 2016-10-11: qty 400

## 2016-10-11 MED ORDER — VANCOMYCIN HCL IN DEXTROSE 1-5 GM/200ML-% IV SOLN
1000.0000 mg | Freq: Once | INTRAVENOUS | Status: AC
  Administered 2016-10-11: 1000 mg via INTRAVENOUS

## 2016-10-11 MED ORDER — LIDOCAINE-EPINEPHRINE 2 %-1:100000 IJ SOLN
20.0000 mL | Freq: Once | INTRAMUSCULAR | Status: AC
  Administered 2016-10-11: 20 mL via INTRADERMAL
  Filled 2016-10-11: qty 1

## 2016-10-11 MED ORDER — ACETAMINOPHEN 325 MG PO TABS
650.0000 mg | ORAL_TABLET | Freq: Once | ORAL | Status: AC
  Administered 2016-10-11: 650 mg via ORAL

## 2016-10-11 NOTE — ED Notes (Signed)
Patient transported to CT 

## 2016-10-11 NOTE — ED Notes (Signed)
[facility] the [facility] via [facility]

## 2016-10-11 NOTE — ED Triage Notes (Addendum)
Presents with large red, indurated, warm areas to right buttock. Began Tuesday and became larger. Endorses chills and pain. Currently taking Cefidinir  for a URI. Endorses inability to eat. Temp here 100.1. Home remedies of heat and epsom salts not working.  HR 102.  BP 133/62.

## 2016-10-11 NOTE — ED Provider Notes (Addendum)
[facility] Provider Note   CSN: [id number] Arrival date & time: 10/11/16  1803  By signing my name below, I, Alyssa GroveMartin Green, attest that this documentation has been prepared under the direction and in the presence of Shaune Pollackameron Brighton Delio, MD. Electronically Signed: Alyssa GroveMartin Green, ED Scribe. 10/11/16. 8:10 PM.   History   Chief Complaint Chief Complaint  Patient presents with  . Abscess   The history is provided by the patient. No language interpreter was used.   HPI Comments: Jordan Ellison is a 50 y.o. female who presents to the Emergency Department complaining of gradual onset and worsening, large red, indurated, warm areas to the interior aspect of the right buttock onset 4 days. She states it feels hard to touch and the surrounding area is feeling swollen. Pain is aching and throbbing and made worse with any movement. She states it is painful and difficult to sit down Pt reports associated fever, chills, nausea, coughing and difficulty eating. Pt has tried heat and epsom salts with no relief to symptoms. Pt denies vomiting. Pt is currently taking Omnicef for URI.  Pt has hx of anal fissure.  Past Medical History:  Diagnosis Date  . Anxiety and depression 10/29/2010   Qualifier: Diagnosis of  By: Peggyann Juba'Sullivan FNP, Katrinka BlazingMelissa S   . Cervical cancer screening 02/23/2012  . Decreased visual acuity 03/06/2015  . Depression   . Eating disorder   . Fainting spell   . Genital warts 07/06/2013  . GERD (gastroesophageal reflux disease) 10/29/2010   Qualifier: Diagnosis of  By: Peggyann Juba'Sullivan FNP, Katrinka BlazingMelissa S   . History of UTI   . Hyperlipidemia   . Hyperlipidemia 11/22/2010   Qualifier: Diagnosis of  By: Peggyann Juba'Sullivan FNP, Katrinka BlazingMelissa S   . Insomnia 04/15/2012  . Occipital neuralgia 03/02/2015  . Tinnitus 03/02/2015    Patient Active Problem List   Diagnosis Date Noted  . Abscess of buttock 10/11/2016  . BPPV (benign paroxysmal positional vertigo) 05/24/2015  . Neck pain 03/14/2015  . Decreased  visual acuity 03/06/2015  . Tachycardia 03/06/2015  . Atypical chest pain 03/06/2015  . Pain in joint, shoulder region 03/06/2015  . Tinnitus 03/02/2015  . Occipital neuralgia 03/02/2015  . Other fatigue 10/01/2014  . Subacute nonsuppurative otitis media of both ears 10/01/2014  . Elevated liver function tests 10/01/2014  . Preventative health care 10/01/2014  . Genital warts 07/06/2013  . Insomnia 04/15/2012  . Cervical cancer screening 02/23/2012  . Abdominal bloating 04/22/2011  . Chronic fatigue 04/22/2011  . Hyperlipidemia 11/22/2010  . ENDOMETRIOSIS 11/22/2010  . Hyperglycemia 11/22/2010  . Constipation 11/05/2010  . RECTAL BLEEDING 11/05/2010  . Anxiety and depression 10/29/2010  . RECTAL FISSURE 10/29/2010  . MENOPAUSE, SURGICAL 10/29/2010  . LEG CRAMPS 10/29/2010  . GERD (gastroesophageal reflux disease) 10/29/2010    Past Surgical History:  Procedure Laterality Date  . TOTAL ABDOMINAL HYSTERECTOMY  07/2005    OB History    No data available       Home Medications    Prior to Admission medications   Medication Sig Start Date End Date Taking? Authorizing Provider  cefdinir (OMNICEF) 300 MG capsule Take 300 mg by mouth 2 (two) times daily.   Yes Historical Provider, MD  FOLIC ACID PO Take 600 mg by mouth daily.    Yes Historical Provider, MD  MAGNESIUM PO Take 400 mg by mouth 2 (two) times daily.    Yes Historical Provider, MD  Probiotic Product (TRUNATURE DIGESTIVE PROBIOTIC) CAPS Take by mouth daily.  Yes Historical Provider, MD  ALPRAZolam (XANAX) 0.25 MG tablet Take 1 tablet (0.25 mg total) by mouth 2 (two) times daily as needed for anxiety. 03/02/15   Bradd Canary, MD  aspirin 81 MG tablet Take 81 mg by mouth daily.    Historical Provider, MD  meclizine (ANTIVERT) 25 MG tablet Take 1 tablet (25 mg total) by mouth 3 (three) times daily as needed. 05/28/15   Lenda Kelp, MD  ondansetron (ZOFRAN) 4 MG tablet Take 1 tablet (4 mg total) by mouth every 8  (eight) hours as needed for nausea or vomiting. 05/01/15   Lenda Kelp, MD  ranitidine (ZANTAC) 150 MG tablet Take 1 tablet (150 mg total) by mouth 2 (two) times daily as needed for heartburn. 03/02/15   Bradd Canary, MD    Family History Family History  Problem Relation Age of Onset  . Uterine cancer Mother   . Kidney failure Mother   . Cancer Mother     kidney, uterine  . Heart disease Mother   . Hypertension Mother   . Hyperlipidemia Mother   . Thyroid disease Mother     likely hyperactive  . Heart attack Father     deceased in 50  . Heart disease Father     MI  . Hyperlipidemia Father   . COPD Sister     smoker  . Other Sister     2 stents  . Heart disease Sister   . Hyperlipidemia Sister   . Obesity Sister   . Hypertension Sister   . Hyperlipidemia Sister   . Obesity Sister   . Thyroid disease Sister     hypothyroid  . Diabetes Sister     type 2- diet controlled  . GER disease Brother   . Hyperlipidemia Brother   . Hypertension Brother   . Hyperlipidemia Brother   . Iron deficiency Brother   . Other Brother     pigment issue/ poor circulation  . Hyperlipidemia Brother   . Hemochromatosis Brother   . Peripheral vascular disease Brother   . Seizures Brother   . Other Brother     Epilepsy  . Thyroid disease Brother     hyper  . Diabetes Brother     type 2- diet controlled    Social History Social History  Substance Use Topics  . Smoking status: Former Games developer  . Smokeless tobacco: Not on file     Comment: quit smoking in 2000  . Alcohol use 0.0 oz/week     Comment: occasional glass of wine     Allergies   Iodine; Sulfonamide derivatives; and Tylenol with codeine #3 [acetaminophen-codeine]   Review of Systems Review of Systems  Constitutional: Positive for chills and fever.  Respiratory: Positive for cough.   Gastrointestinal: Positive for constipation and nausea. Negative for vomiting.  Genitourinary: Positive for pelvic pain.  Skin:  Positive for rash.  All other systems reviewed and are negative.  Physical Exam Updated Vital Signs BP 114/62   Pulse 93   Temp 98.6 F (37 C) (Oral)   Resp 20   Ht 5\' 3"  (1.6 m)   Wt 220 lb (99.8 kg)   SpO2 96%   BMI 38.97 kg/m   Physical Exam  Constitutional: She is oriented to person, place, and time. She appears well-developed and well-nourished. No distress.  HENT:  Head: Normocephalic and atraumatic.  Eyes: Conjunctivae are normal.  Neck: Neck supple.  Cardiovascular: Normal rate, regular rhythm and normal heart sounds.  Exam reveals no friction rub.   No murmur heard. Pulmonary/Chest: Effort normal and breath sounds normal. No respiratory distress. She has no wheezes. She has no rales.  Abdominal: She exhibits no distension.  Genitourinary:  Genitourinary Comments: Approximately 15 cm area of warm erythema and induration extending from the right medial gluteal cleft laterally along the inferior buttocks. There is marked fluctuance along the medial most 4-5 cm involving the perianal area. There is tenderness and fluctuance with digital rectal exam along the right side. No purulence. No draining fistulas.  Musculoskeletal: She exhibits no edema.  Neurological: She is alert and oriented to person, place, and time. She exhibits normal muscle tone.  Skin: Skin is warm. Capillary refill takes less than 2 seconds.  Psychiatric: She has a normal mood and affect.  Nursing note and vitals reviewed.    ED Treatments / Results  DIAGNOSTIC STUDIES: Oxygen Saturation is 98% on RA, normal by my interpretation.    COORDINATION OF CARE: 6:48 PM Discussed treatment plan with pt at bedside which includes rectal exam and basic lab work and pt agreed to plan.  Labs (all labs ordered are listed, but only abnormal results are displayed) Labs Reviewed  CBC WITH DIFFERENTIAL/PLATELET - Abnormal; Notable for the following:       Result Value   WBC 15.9 (*)    Neutro Abs 12.3 (*)     Monocytes Absolute 1.7 (*)    All other components within normal limits  BASIC METABOLIC PANEL - Abnormal; Notable for the following:    Glucose, Bld 117 (*)    Calcium 8.8 (*)    All other components within normal limits  CULTURE, BLOOD (ROUTINE X 2)  CULTURE, BLOOD (ROUTINE X 2)  AEROBIC/ANAEROBIC CULTURE (SURGICAL/DEEP WOUND)  I-STAT CG4 LACTIC ACID, ED  I-STAT CG4 LACTIC ACID, ED    EKG  EKG Interpretation None       Radiology Ct Pelvis Wo Contrast  Result Date: 10/11/2016 CLINICAL DATA:  Patient had bump on right buttocks since Monday which has progressively gotten worse. Chills and pain. EXAM: CT PELVIS WITHOUT CONTRAST TECHNIQUE: Multidetector CT imaging of the pelvis was performed following the standard protocol without intravenous contrast. COMPARISON:  Apr 24, 2011 CT the abdomen and pelvis FINDINGS: Urinary Tract:  The bladder is unremarkable. Bowel:  Visualized bowel is unremarkable. Vascular/Lymphatic: No pathologically enlarged lymph nodes. No significant vascular abnormality seen. Reproductive:  The patient is status post hysterectomy. Other:  No other abnormalities. Musculoskeletal: There is an abscess in the medial deep right buttocks which extends into the perianal location. The abscess measures up to 8.7 x 4.7 cm on series 2, image 127. Soft tissue gas does extend across midline to the left as seen on coronal image 133. The abscess does not abut the bones and there is no bony erosion to suggest osteomyelitis. There is skin thickening overlying the abscess. The remainder of the bones are unremarkable other than degenerative changes at L5-S1. IMPRESSION: Abscess in the deep medial right buttocks extending into a perianal location with minimal extension across midline to the left. No osteomyelitis identified. Electronically Signed   By: Gerome Samavid  Williams III M.D   On: 10/11/2016 20:41    Procedures .Marland Kitchen.Incision and Drainage Date/Time: 10/11/2016 11:11 PM Performed by:  Shaune PollackISAACS, Shunte Senseney Authorized by: Shaune PollackISAACS, Isidore Margraf   Consent:    Consent obtained:  Verbal   Consent given by:  Patient   Risks discussed:  Bleeding, incomplete drainage, pain, infection and damage to other organs  Alternatives discussed:  Delayed treatment and alternative treatment Location:    Type:  Abscess   Size:  9 x 5   Location:  Lower extremity   Lower extremity location:  Buttock   Buttock location:  R buttock Pre-procedure details:    Skin preparation:  Antiseptic wash and Chloraprep Anesthesia (see MAR for exact dosages):    Anesthesia method:  Local infiltration   Local anesthetic:  Lidocaine 1% WITH epi Procedure type:    Complexity:  Simple Procedure details:    Needle aspiration: yes     Needle size:  18 G   Drainage:  Purulent   Drainage amount:  Copious   Packing materials:  None Post-procedure details:    Patient tolerance of procedure:  Tolerated well, no immediate complications   (including critical care time)  Medications Ordered in ED Medications  sodium chloride 0.9 % bolus 1,000 mL (0 mLs Intravenous Stopped 10/11/16 2009)  sodium chloride 0.9 % bolus 1,000 mL (1,000 mLs Intravenous New Bag/Given 10/11/16 2009)  piperacillin-tazobactam (ZOSYN) IVPB 3.375 g (3.375 g Intravenous New Bag/Given 10/11/16 2003)  acetaminophen (TYLENOL) tablet 650 mg (650 mg Oral Given 10/11/16 2016)  vancomycin (VANCOCIN) IVPB 1000 mg/200 mL premix (1,000 mg Intravenous Transfusing/Transfer 10/11/16 2249)    And  vancomycin (VANCOCIN) IVPB 1000 mg/200 mL premix (1,000 mg Intravenous New Bag/Given 10/11/16 2214)  lidocaine-EPINEPHrine (XYLOCAINE W/EPI) 2 %-1:100000 (with pres) injection 20 mL (20 mLs Intradermal Given 10/11/16 2124)     Initial Impression / Assessment and Plan / ED Course  I have reviewed the triage vital signs and the nursing notes.  Pertinent labs & imaging results that were available during my care of the patient were reviewed by me and considered in  my medical decision making (see chart for details).  Clinical Course   50 year old female with past medical history of obesity and borderline diabetes who presents with a several-day history of right gluteal redness and pain. On arrival, patient has borderline fever and mild tachycardia. She is systemically nontoxic however. Labs showed leukocytosis of 16,000. Exam is concerning for perirectal and large gluteal abscess. CT scan subsequently obtained and shows large, deep gluteal abscess. Patient now febrile as well. Concern for sepsis 2/2 gluteal abscess with cellulitis. Vanc/Zosyn given. Discussed with Dr. Abbey Chatters, who recommend needle aspiration in ED and admission to [facility]. Buttocks aspirated as above, with removal of approx 100 cc of purulent, malodorous fluid. Sent for culture. Admitted to [facility] in stable condition. NPO, IV ABX given.  Final Clinical Impressions(s) / ED Diagnoses   Final diagnoses:  Abscess and cellulitis of gluteal region  Sepsis, due to unspecified organism [facility])    I personally performed the services described in this documentation, which was scribed in my presence. The recorded information has been reviewed and is accurate.    Shaune Pollack, MD 10/11/16 [id number]    Shaune Pollack, MD 10/11/16 [phone]

## 2016-10-11 NOTE — ED Notes (Signed)
[facility] [phone]; will call report to receiving nurse when [facility] comes to pick up patient; [facility]

## 2016-10-11 NOTE — ED Notes (Signed)
States had had a bump on right buttocks since Monday and got progressively worse. Has had chills and pain to right buttocks. Large area to right buttocks reddened.

## 2016-10-12 ENCOUNTER — Encounter (HOSPITAL_COMMUNITY)
Admission: EM | Disposition: A | Discharge status: Home / Self Care | Payer: Self-pay | Source: Home / Self Care | Attending: Emergency Medicine

## 2016-10-12 ENCOUNTER — Observation Stay (HOSPITAL_COMMUNITY): Payer: BLUE CROSS/BLUE SHIELD | Admitting: Anesthesiology

## 2016-10-12 DIAGNOSIS — K611 Rectal abscess: Secondary | ICD-10-CM | POA: Diagnosis present

## 2016-10-12 HISTORY — PX: INCISION AND DRAINAGE ABSCESS: SHX5864

## 2016-10-12 LAB — MRSA PCR SCREENING: MRSA BY PCR: NEGATIVE

## 2016-10-12 SURGERY — INCISION AND DRAINAGE, ABSCESS
Anesthesia: General | Site: Buttocks | Laterality: Right

## 2016-10-12 MED ORDER — MORPHINE SULFATE (PF) 2 MG/ML IV SOLN: 2.0000 mg | INTRAVENOUS | Status: DC | PRN

## 2016-10-12 MED ORDER — 0.9 % SODIUM CHLORIDE (POUR BTL) OPTIME
TOPICAL | Status: DC | PRN
  Administered 2016-10-12: 1000 mL

## 2016-10-12 MED ORDER — ACETAMINOPHEN 10 MG/ML IV SOLN
INTRAVENOUS | Status: AC
  Filled 2016-10-12: qty 100

## 2016-10-12 MED ORDER — LIDOCAINE 2% (20 MG/ML) 5 ML SYRINGE
INTRAMUSCULAR | Status: DC | PRN
  Administered 2016-10-12: 100 mg via INTRAVENOUS

## 2016-10-12 MED ORDER — ONDANSETRON 4 MG PO TBDP: 4.0000 mg | ORAL_TABLET | Freq: Four times a day (QID) | ORAL | Status: DC | PRN

## 2016-10-12 MED ORDER — PROPOFOL 10 MG/ML IV BOLUS
INTRAVENOUS | Status: AC
  Filled 2016-10-12: qty 20

## 2016-10-12 MED ORDER — ONDANSETRON HCL 4 MG/2ML IJ SOLN
4.0000 mg | INTRAMUSCULAR | Status: DC | PRN
  Administered 2016-10-13 (×2): 4 mg via INTRAVENOUS
  Filled 2016-10-12 (×2): qty 2

## 2016-10-12 MED ORDER — BUPIVACAINE HCL (PF) 0.5 % IJ SOLN
INTRAMUSCULAR | Status: AC
  Filled 2016-10-12: qty 30

## 2016-10-12 MED ORDER — PROPOFOL 10 MG/ML IV BOLUS
INTRAVENOUS | Status: DC | PRN
  Administered 2016-10-12: 180 mg via INTRAVENOUS

## 2016-10-12 MED ORDER — FENTANYL CITRATE (PF) 100 MCG/2ML IJ SOLN
INTRAMUSCULAR | Status: DC | PRN
  Administered 2016-10-12 (×2): 100 ug via INTRAVENOUS

## 2016-10-12 MED ORDER — FENTANYL CITRATE (PF) 250 MCG/5ML IJ SOLN
INTRAMUSCULAR | Status: AC
  Filled 2016-10-12: qty 5

## 2016-10-12 MED ORDER — SUCCINYLCHOLINE CHLORIDE 200 MG/10ML IV SOSY
PREFILLED_SYRINGE | INTRAVENOUS | Status: DC | PRN
  Administered 2016-10-12: 100 mg via INTRAVENOUS

## 2016-10-12 MED ORDER — MIDAZOLAM HCL 2 MG/2ML IJ SOLN
INTRAMUSCULAR | Status: AC
  Filled 2016-10-12: qty 2

## 2016-10-12 MED ORDER — ONDANSETRON HCL 4 MG/2ML IJ SOLN
INTRAMUSCULAR | Status: AC
  Filled 2016-10-12: qty 2

## 2016-10-12 MED ORDER — ACETAMINOPHEN 10 MG/ML IV SOLN
INTRAVENOUS | Status: DC | PRN
  Administered 2016-10-12: 1000 mg via INTRAVENOUS

## 2016-10-12 MED ORDER — BENZONATATE 100 MG PO CAPS: 100.0000 mg | ORAL_CAPSULE | Freq: Three times a day (TID) | ORAL | Status: DC | PRN

## 2016-10-12 MED ORDER — PIPERACILLIN-TAZOBACTAM 3.375 G IVPB
3.3750 g | Freq: Three times a day (TID) | INTRAVENOUS | Status: DC
  Administered 2016-10-12 – 2016-10-13 (×4): 3.375 g via INTRAVENOUS
  Filled 2016-10-12 (×4): qty 50

## 2016-10-12 MED ORDER — BUPIVACAINE HCL (PF) 0.5 % IJ SOLN
INTRAMUSCULAR | Status: DC | PRN
  Administered 2016-10-12: 10 mL

## 2016-10-12 MED ORDER — LIDOCAINE 2% (20 MG/ML) 5 ML SYRINGE
INTRAMUSCULAR | Status: AC
  Filled 2016-10-12: qty 5

## 2016-10-12 MED ORDER — ONDANSETRON HCL 4 MG/2ML IJ SOLN
4.0000 mg | Freq: Once | INTRAMUSCULAR | Status: AC | PRN
  Administered 2016-10-12: 4 mg via INTRAVENOUS

## 2016-10-12 MED ORDER — FENTANYL CITRATE (PF) 100 MCG/2ML IJ SOLN: 25.0000 ug | INTRAMUSCULAR | Status: DC | PRN

## 2016-10-12 MED ORDER — OXYCODONE HCL 5 MG PO TABS
5.0000 mg | ORAL_TABLET | ORAL | Status: DC | PRN
  Administered 2016-10-13: 5 mg via ORAL
  Filled 2016-10-12: qty 1

## 2016-10-12 MED ORDER — MIDAZOLAM HCL 5 MG/5ML IJ SOLN
INTRAMUSCULAR | Status: DC | PRN
  Administered 2016-10-12: 2 mg via INTRAVENOUS

## 2016-10-12 MED ORDER — HYDROMORPHONE HCL 1 MG/ML IJ SOLN: 0.2500 mg | INTRAMUSCULAR | Status: DC | PRN

## 2016-10-12 MED ORDER — SODIUM CHLORIDE 0.9 % IV SOLN
INTRAVENOUS | Status: DC
  Administered 2016-10-12 – 2016-10-13 (×2): via INTRAVENOUS

## 2016-10-12 SURGICAL SUPPLY — 23 items
BNDG GAUZE ELAST 4 BULKY (GAUZE/BANDAGES/DRESSINGS) IMPLANT
CHLORAPREP W/TINT 26ML (MISCELLANEOUS) ×3 IMPLANT
COVER SURGICAL LIGHT HANDLE (MISCELLANEOUS) ×3 IMPLANT
DECANTER SPIKE VIAL GLASS SM (MISCELLANEOUS) IMPLANT
DRAPE LAPAROSCOPIC ABDOMINAL (DRAPES) IMPLANT
DRSG PAD ABDOMINAL 8X10 ST (GAUZE/BANDAGES/DRESSINGS) IMPLANT
ELECT REM PT RETURN 9FT ADLT (ELECTROSURGICAL) ×3
ELECTRODE REM PT RTRN 9FT ADLT (ELECTROSURGICAL) ×1 IMPLANT
GAUZE SPONGE 4X4 12PLY STRL (GAUZE/BANDAGES/DRESSINGS) IMPLANT
GLOVE ECLIPSE 8.0 STRL XLNG CF (GLOVE) ×3 IMPLANT
GLOVE INDICATOR 8.0 STRL GRN (GLOVE) ×3 IMPLANT
GOWN STRL REUS W/TWL XL LVL3 (GOWN DISPOSABLE) ×6 IMPLANT
KIT BASIN OR (CUSTOM PROCEDURE TRAY) ×3 IMPLANT
NEEDLE HYPO 25X1 1.5 SAFETY (NEEDLE) IMPLANT
PACK GENERAL/GYN (CUSTOM PROCEDURE TRAY) ×3 IMPLANT
SUT MNCRL AB 4-0 PS2 18 (SUTURE) IMPLANT
SUT VIC AB 3-0 SH 27 (SUTURE)
SUT VIC AB 3-0 SH 27XBRD (SUTURE) IMPLANT
SWAB COLLECTION DEVICE MRSA (MISCELLANEOUS) IMPLANT
SWAB CULTURE ESWAB REG 1ML (MISCELLANEOUS) IMPLANT
SYR CONTROL 10ML LL (SYRINGE) IMPLANT
TOWEL OR 17X26 10 PK STRL BLUE (TOWEL DISPOSABLE) ×3 IMPLANT
TOWEL OR NON WOVEN STRL DISP B (DISPOSABLE) ×3 IMPLANT

## 2016-10-12 NOTE — H&P (Signed)
Jordan Ellison is an 50 y.o. female.   Chief Complaint:  Perianal pain HPI: This is a 50 year old female who began feeling some peri-anal discomfort about 9 days ago. She then noticed increasing swelling and firmness. She began to feel poorly and thought she had upper respiratory infection. She was prescribed some antibiotics for this.  She became more uncomfortable then looked at the area in the mirror and noted that it was large and red. She presented to the [facility] and was noted to have a perirectal abscess on exam and CT. Needle aspiration of 100 cc of pus was performed and then she was transferred here for definitive treatment. She was given IV antibiotics at [facility], [place].  Past Medical History:  Diagnosis Date  . Anxiety and depression 10/29/2010   Qualifier: Diagnosis of  By: Inda Castle FNP, [place]   . Cervical cancer screening 02/23/2012  . Decreased visual acuity 03/06/2015  . Depression   . Eating disorder   . Fainting spell   . Genital warts 07/06/2013  . GERD (gastroesophageal reflux disease) 10/29/2010   Qualifier: Diagnosis of  By: Inda Castle FNP, [place]   . History of UTI   . Hyperlipidemia   . Hyperlipidemia 11/22/2010   Qualifier: Diagnosis of  By: Inda Castle FNP, [place]   . Insomnia 04/15/2012  . Occipital neuralgia 03/02/2015  . Tinnitus 03/02/2015    Past Surgical History:  Procedure Laterality Date  . TOTAL ABDOMINAL HYSTERECTOMY  07/2005    Family History  Problem Relation Age of Onset  . Uterine cancer Mother   . Kidney failure Mother   . Cancer Mother     kidney, uterine  . Heart disease Mother   . Hypertension Mother   . Hyperlipidemia Mother   . Thyroid disease Mother     likely hyperactive  . Heart attack Father     deceased in 17  . Heart disease Father     MI  . Hyperlipidemia Father   . COPD Sister     smoker  . Other Sister     2 stents  . Heart disease Sister   . Hyperlipidemia Sister   . Obesity  Sister   . Hypertension Sister   . Hyperlipidemia Sister   . Obesity Sister   . Thyroid disease Sister     hypothyroid  . Diabetes Sister     type 2- diet controlled  . GER disease Brother   . Hyperlipidemia Brother   . Hypertension Brother   . Hyperlipidemia Brother   . Iron deficiency Brother   . Other Brother     pigment issue/ poor circulation  . Hyperlipidemia Brother   . Hemochromatosis Brother   . Peripheral vascular disease Brother   . Seizures Brother   . Other Brother     Epilepsy  . Thyroid disease Brother     hyper  . Diabetes Brother     type 2- diet controlled   Social History:  reports that she has quit smoking. She does not have any smokeless tobacco history on file. She reports that she drinks alcohol. She reports that she does not use drugs.  Allergies:  Allergies  Allergen Reactions  . Iodine   . Sulfonamide Derivatives     REACTION: fever, weakness  . Tylenol With Codeine #3 [Acetaminophen-Codeine] Nausea And Vomiting    States, " Codeine makes me throw up"     Medications Prior to Admission  Medication Sig Dispense Refill  .  benzonatate (TESSALON) 100 MG capsule Take 1 capsule by mouth 3 (three) times daily as needed for cough.    . cefdinir (OMNICEF) 300 MG capsule Take 300 mg by mouth 2 (two) times daily.    Marland Kitchen FOLIC ACID PO Take 121 mg by mouth daily.     Marland Kitchen MAGNESIUM PO Take 400 mg by mouth 2 (two) times daily.     Vladimir Faster Glycol-Propyl Glycol (SYSTANE OP) Apply 1-2 drops to eye daily as needed (dry eyes).    . Probiotic Product (TRUNATURE DIGESTIVE PROBIOTIC) CAPS Take 1 tablet by mouth daily.     Marland Kitchen ALPRAZolam (XANAX) 0.25 MG tablet Take 1 tablet (0.25 mg total) by mouth 2 (two) times daily as needed for anxiety. (Patient not taking: Reported on 10/12/2016) 40 tablet 0  . meclizine (ANTIVERT) 25 MG tablet Take 1 tablet (25 mg total) by mouth 3 (three) times daily as needed. (Patient not taking: Reported on 10/12/2016) 60 tablet 1  .  ondansetron (ZOFRAN) 4 MG tablet Take 1 tablet (4 mg total) by mouth every 8 (eight) hours as needed for nausea or vomiting. (Patient not taking: Reported on 10/12/2016) 40 tablet 1  . ranitidine (ZANTAC) 150 MG tablet Take 1 tablet (150 mg total) by mouth 2 (two) times daily as needed for heartburn. (Patient not taking: Reported on 10/12/2016) 180 tablet 3    Results for orders placed or performed during the hospital encounter of 10/11/16 (from the past 48 hour(s))  CBC with Differential     Status: Abnormal   Collection Time: 10/11/16  7:15 PM  Result Value Ref Range   WBC 15.9 (H) 4.0 - 10.5 K/uL   RBC 4.12 3.87 - 5.11 MIL/uL   Hemoglobin 13.3 12.0 - 15.0 g/dL   HCT 39.7 36.0 - 46.0 %   MCV 96.4 78.0 - 100.0 fL   MCH 32.3 26.0 - 34.0 pg   MCHC 33.5 30.0 - 36.0 g/dL   RDW 12.4 11.5 - 15.5 %   Platelets 236 150 - 400 K/uL   Neutrophils Relative % 75 %   Lymphocytes Relative 11 %   Monocytes Relative 11 %   Eosinophils Relative 1 %   Basophils Relative 0 %   Band Neutrophils 2 %   Neutro Abs 12.3 (H) 1.7 - 7.7 K/uL   Lymphs Abs 1.7 0.7 - 4.0 K/uL   Monocytes Absolute 1.7 (H) 0.1 - 1.0 K/uL   Eosinophils Absolute 0.2 0.0 - 0.7 K/uL   Basophils Absolute 0.0 0.0 - 0.1 K/uL  Basic metabolic panel     Status: Abnormal   Collection Time: 10/11/16  7:15 PM  Result Value Ref Range   Sodium 137 135 - 145 mmol/L   Potassium 3.9 3.5 - 5.1 mmol/L   Chloride 104 101 - 111 mmol/L   CO2 24 22 - 32 mmol/L   Glucose, Bld 117 (H) 65 - 99 mg/dL   BUN 15 6 - 20 mg/dL   Creatinine, Ser 0.85 0.44 - 1.00 mg/dL   Calcium 8.8 (L) 8.9 - 10.3 mg/dL   GFR calc non Af Amer >60 >60 mL/min   GFR calc Af Amer >60 >60 mL/min    Comment: (NOTE) The eGFR has been calculated using the CKD EPI equation. This calculation has not been validated in all clinical situations. eGFR's persistently <60 mL/min signify possible Chronic Kidney Disease.    Anion gap 9 5 - 15  I-Stat CG4 Lactic Acid, ED     Status:  None  Collection Time: 10/11/16  7:21 PM  Result Value Ref Range   Lactic Acid, Venous 1.57 0.5 - 1.9 mmol/L  MRSA PCR Screening     Status: None   Collection Time: 10/12/16  4:16 AM  Result Value Ref Range   MRSA by PCR NEGATIVE NEGATIVE    Comment:        The GeneXpert MRSA Assay (FDA approved for NASAL specimens only), is one component of a comprehensive MRSA colonization surveillance program. It is not intended to diagnose MRSA infection nor to guide or monitor treatment for MRSA infections.    Ct Pelvis Wo Contrast  Result Date: 10/11/2016 CLINICAL DATA:  Patient had bump on right buttocks since Monday which has progressively gotten worse. Chills and pain. EXAM: CT PELVIS WITHOUT CONTRAST TECHNIQUE: Multidetector CT imaging of the pelvis was performed following the standard protocol without intravenous contrast. COMPARISON:  Apr 24, 2011 CT the abdomen and pelvis FINDINGS: Urinary Tract:  The bladder is unremarkable. Bowel:  Visualized bowel is unremarkable. Vascular/Lymphatic: No pathologically enlarged lymph nodes. No significant vascular abnormality seen. Reproductive:  The patient is status post hysterectomy. Other:  No other abnormalities. Musculoskeletal: There is an abscess in the medial deep right buttocks which extends into the perianal location. The abscess measures up to 8.7 x 4.7 cm on series 2, image 127. Soft tissue gas does extend across midline to the left as seen on coronal image 133. The abscess does not abut the bones and there is no bony erosion to suggest osteomyelitis. There is skin thickening overlying the abscess. The remainder of the bones are unremarkable other than degenerative changes at L5-S1. IMPRESSION: Abscess in the deep medial right buttocks extending into a perianal location with minimal extension across midline to the left. No osteomyelitis identified. Electronically Signed   By: Dorise Bullion III M.D   On: 10/11/2016 20:41    Review of Systems   Constitutional: Positive for chills, fever and malaise/fatigue. Negative for weight loss.  Respiratory: Positive for cough. Negative for shortness of breath.   Cardiovascular: Negative for chest pain.  Gastrointestinal: Negative for abdominal pain, blood in stool, constipation, diarrhea, nausea and vomiting.  Genitourinary: Negative for dysuria and hematuria.  Neurological: Negative for focal weakness and seizures.  Endo/Heme/Allergies:       No history of blood clots.   rest of review of systems is negative.  Blood pressure 119/66, pulse 80, temperature 98.4 F (36.9 C), temperature source Oral, resp. rate 19, height '5\' 3"'  (1.6 m), weight 106.6 kg (235 lb), SpO2 95 %. Physical Exam  Constitutional: She is oriented to person, place, and time. No distress.  Morbidly obese female  HENT:  Head: Normocephalic and atraumatic.  Eyes: EOM are normal. No scleral icterus.  Neck: Neck supple.  Cardiovascular: Normal rate and regular rhythm.   Respiratory: Effort normal and breath sounds normal.  GI: Soft. There is no tenderness.  Obese  Genitourinary:  Genitourinary Comments: There is right perianal swelling, induration, erythema, tenderness, and a small amount of purulent drainage coming from a punctate wound.  Musculoskeletal: She exhibits no edema or tenderness.  Neurological: She is alert and oriented to person, place, and time.  Skin: Skin is warm and dry.  Psychiatric: She has a normal mood and affect. Her behavior is normal.     Assessment/Plan Anal rectal abscess that is partially drained. She's been given intravenous antibiotics.  Plan: Will take her to the operating room for incision and drainage of anorectal abscess. Procedure and risks  have been discussed with her. Risks include but are not limited to bleeding, recurrent infection, wound healing problems, anesthesia. She seems to understand all this and agrees with the plan.  Odis Hollingshead, MD 10/12/2016, 6:56 AM

## 2016-10-12 NOTE — Anesthesia Procedure Notes (Signed)
Procedure Name: Intubation Date/Time: 10/12/2016 7:56 AM Performed by: Enriqueta ShutterWILLIFORD, Jaysa Kise D Pre-anesthesia Checklist: Patient identified, Emergency Drugs available, Suction available and Patient being monitored Patient Re-evaluated:Patient Re-evaluated prior to inductionOxygen Delivery Method: Circle system utilized Preoxygenation: Pre-oxygenation with 100% oxygen Intubation Type: IV induction Ventilation: Mask ventilation without difficulty Laryngoscope Size: Mac and 4 Grade View: Grade I Tube type: Oral Tube size: 7.5 mm Number of attempts: 1 Airway Equipment and Method: Stylet Placement Confirmation: ETT inserted through vocal cords under direct vision,  positive ETCO2 and breath sounds checked- equal and bilateral Secured at: 21 cm Tube secured with: Tape Dental Injury: Teeth and Oropharynx as per pre-operative assessment

## 2016-10-12 NOTE — Progress Notes (Signed)
Pt has returned for PACU. Alert and oriented. VS WNL. Packing changed to buttocks. Writer aware to NOT pull drain tube.

## 2016-10-12 NOTE — Anesthesia Postprocedure Evaluation (Signed)
Anesthesia Post Note  Patient: Jordan MullerDebora S Ellison  Procedure(s) Performed: Procedure(s) (LRB): INCISION AND DRAINAGE PERIRECTAL ABSCESS (Right)  Patient location during evaluation: PACU Anesthesia Type: General Level of consciousness: awake and alert Pain management: pain level controlled Vital Signs Assessment: post-procedure vital signs reviewed and stable Respiratory status: spontaneous breathing, nonlabored ventilation, respiratory function stable and patient connected to nasal cannula oxygen Cardiovascular status: blood pressure returned to baseline and stable Postop Assessment: no signs of nausea or vomiting Anesthetic complications: no    Last Vitals:  Vitals:   10/12/16 0900 10/12/16 0908  BP: 108/64   Pulse: 73 74  Resp: 10 14  Temp:      Last Pain:  Vitals:   10/12/16 0731  TempSrc: Oral  PainSc:                  [name] JENNETTE

## 2016-10-12 NOTE — Op Note (Signed)
Operative Note  Jordan MullerDebora S Ellison female 50 y.o. 10/12/2016  PREOPERATIVE DX:  Right perirectal abscess  POSTOPERATIVE DX:  Same (ishiorectal)  PROCEDURE:   Incision and drainage of right perirectal abscess          Surgeon: Adolph PollackOSENBOWER,Kiyah Demartini J   Assistants: None  Anesthesia: General   Indications:   This is a 50 year old female with a large right perirectal abscess who now presents for drainage.    Procedure Detail:  She was brought to the operating room placed supine on the operating table in the general anesthetic was given. She was placed in the lithotomy position. The perianal area were sterilely prepped and draped. A timeout was performed.  A cruciate incision was made in the right perianal area. A large amount of pus and air was evacuated. A deep cavity which extended approximately 7 cm in the ischial rectal space was explored digitally and loculations broken up. I also did a simultaneous digital rectal exam and did not feel any obvious defect in the anal canal. I cut the corners off some of the cruciate incision creating a defect. Then controlled the bleeding from the subcutaneous tissue with electrocautery. A half-inch Penrose drain was then placed into the abscess cavity and anchored to the perianal skin with 3-0 chromic suture. A right perianal block with Marcaine was performed.  a bulky dressing was applied.  She tolerated the procedure without any apparent complications. She was taken to the recovery room in satisfactory condition.    Estimated Blood Loss:  100 ml               Complications:  * No complications entered in OR log *         Disposition: PACU - hemodynamically stable.         Condition: stable

## 2016-10-12 NOTE — Transfer of Care (Signed)
Immediate Anesthesia Transfer of Care Note  Patient: Jordan Ellison  Procedure(s) Performed: Procedure(s): INCISION AND DRAINAGE PERIRECTAL ABSCESS (Right)  Patient Location: PACU  Anesthesia Type:General  Level of Consciousness: awake, alert  and oriented  Airway & Oxygen Therapy: Patient Spontanous Breathing and Patient connected to face mask oxygen  Post-op Assessment: Report given to RN and Post -op Vital signs reviewed and stable  Post vital signs: Reviewed and stable  Last Vitals:  Vitals:   10/12/16 0557 10/12/16 0731  BP: 119/66 125/76  Pulse: 80 77  Resp: 19 20  Temp: 36.9 C 36.4 C    Last Pain:  Vitals:   10/12/16 0731  TempSrc: Oral  PainSc:          Complications: No apparent anesthesia complications

## 2016-10-12 NOTE — Anesthesia Preprocedure Evaluation (Signed)
Anesthesia Evaluation  Patient identified by MRN, date of birth, ID band Patient awake    Reviewed: Allergy & Precautions, NPO status , Patient's Chart, lab work & pertinent test results  History of Anesthesia Complications Negative for: history of anesthetic complications  Airway Mallampati: III  TM Distance: >3 FB Neck ROM: Full    Dental no notable dental hx. (+) Dental Advisory Given   Pulmonary former smoker,    Pulmonary exam normal breath sounds clear to auscultation       Cardiovascular negative cardio ROS Normal cardiovascular exam Rhythm:Regular Rate:Normal     Neuro/Psych PSYCHIATRIC DISORDERS Anxiety Depression negative neurological ROS     GI/Hepatic negative GI ROS, Neg liver ROS,   Endo/Other  Morbid obesity  Renal/GU negative Renal ROS  negative genitourinary   Musculoskeletal negative musculoskeletal ROS (+)   Abdominal   Peds negative pediatric ROS (+)  Hematology negative hematology ROS (+)   Anesthesia Other Findings   Reproductive/Obstetrics negative OB ROS                             Anesthesia Physical Anesthesia Plan  ASA: III  Anesthesia Plan: General   Post-op Pain Management:    Induction: Intravenous  Airway Management Planned: LMA  Additional Equipment:   Intra-op Plan:   Post-operative Plan: Extubation in OR  Informed Consent: I have reviewed the patients History and Physical, chart, labs and discussed the procedure including the risks, benefits and alternatives for the proposed anesthesia with the patient or authorized representative who has indicated his/her understanding and acceptance.   Dental advisory given  Plan Discussed with: CRNA  Anesthesia Plan Comments:         Anesthesia Quick Evaluation

## 2016-10-13 ENCOUNTER — Encounter (HOSPITAL_COMMUNITY): Payer: Self-pay | Admitting: General Surgery

## 2016-10-13 MED ORDER — MENTHOL 3 MG MT LOZG
1.0000 | LOZENGE | OROMUCOSAL | Status: DC | PRN
  Filled 2016-10-13: qty 9

## 2016-10-13 MED ORDER — CIPROFLOXACIN HCL 500 MG PO TABS: 500.0000 mg | ORAL_TABLET | Freq: Two times a day (BID) | ORAL | 0 refills | Status: AC

## 2016-10-13 MED ORDER — METRONIDAZOLE 500 MG PO TABS: 500.0000 mg | ORAL_TABLET | Freq: Three times a day (TID) | ORAL | 0 refills | Status: AC

## 2016-10-13 MED ORDER — TRAMADOL HCL 50 MG PO TABS: 50.0000 mg | ORAL_TABLET | Freq: Four times a day (QID) | ORAL | 0 refills | Status: AC | PRN

## 2016-10-13 NOTE — Discharge Instructions (Signed)
Clean area in shower daily.  Wear maxi pad in underwear.  May return to work Thursday.  Light activities until you are pain-free then activities as tolerated.  Call for heavy bleeding or signs of recurrent infection as we discussed.  Taken medications as directed.  Follow up appointment in one week (post operative) with Dr. Abbey Chattersosenbower.  Please call [phone] to make appointment.

## 2016-10-13 NOTE — Progress Notes (Signed)
Patient was given discharge instructions along with prescriptions. No questions or concerns at this time. Patient transported via wheelchair to the car.

## 2016-10-13 NOTE — Progress Notes (Signed)
Assessment Principal Problem:   Perirectal abscess s/p I & D 10/12/16-doing well   Plan:  Discharge today.  Instructions given to her.   LOS: 0 days     1 Day Post-Op  Subjective: A little sore.  Eating ok.  Objective: Vital signs in last 24 hours: Temp:  [97.3 F (36.3 C)-99.1 F (37.3 C)] 98 F (36.7 C) (10/30 0518) Pulse Rate:  [64-89] 64 (10/30 0518) Resp:  [10-23] 16 (10/29 1001) BP: (107-123)/(60-69) 114/64 (10/30 0518) SpO2:  [95 %-100 %] 95 % (10/30 0518) Last BM Date: 10/11/16  Intake/Output from previous day: 10/29 0701 - 10/30 0700 In: 3518.3 [P.O.:480; I.V.:2888.3; IV Piggyback:150] Out: 0  Intake/Output this shift: No intake/output data recorded.  PE: General- In NAD Anorectal-right perianal wound clean; penrose drain in place.  Lab Results:   Recent Labs  10/11/16 1915  WBC 15.9*  HGB 13.3  HCT 39.7  PLT 236   BMET  Recent Labs  10/11/16 1915  NA 137  K 3.9  CL 104  CO2 24  GLUCOSE 117*  BUN 15  CREATININE 0.85  CALCIUM 8.8*   PT/INR No results for input(s): LABPROT, INR in the last 72 hours. Comprehensive Metabolic Panel:    Component Value Date/Time   NA 137 10/11/2016 1915   NA 139 03/02/2015 1647   K 3.9 10/11/2016 1915   K 4.1 03/02/2015 1647   CL 104 10/11/2016 1915   CL 107 03/02/2015 1647   CO2 24 10/11/2016 1915   CO2 20 03/02/2015 1647   BUN 15 10/11/2016 1915   BUN 15 03/02/2015 1647   CREATININE 0.85 10/11/2016 1915   CREATININE 0.56 03/02/2015 1647   CREATININE 0.7 09/25/2014 1531   CREATININE 0.65 04/12/2012 1109   GLUCOSE 117 (H) 10/11/2016 1915   GLUCOSE 105 (H) 03/02/2015 1647   CALCIUM 8.8 (L) 10/11/2016 1915   CALCIUM 9.1 03/02/2015 1647   AST 37 03/02/2015 1647   AST 89 (H) 09/25/2014 1531   ALT 41 (H) 03/02/2015 1647   ALT 89 (H) 09/25/2014 1531   ALKPHOS 80 03/02/2015 1647   ALKPHOS 74 09/25/2014 1531   BILITOT 0.8 03/02/2015 1647   BILITOT 1.2 09/25/2014 1531   PROT 7.0 03/02/2015 1647   PROT 8.2 09/25/2014 1531   ALBUMIN 4.1 03/02/2015 1647   ALBUMIN 3.8 09/25/2014 1531   ALBUMIN 3.8 09/25/2014 1531     Studies/Results: Ct Pelvis Wo Contrast  Result Date: 10/11/2016 CLINICAL DATA:  Patient had bump on right buttocks since Monday which has progressively gotten worse. Chills and pain. EXAM: CT PELVIS WITHOUT CONTRAST TECHNIQUE: Multidetector CT imaging of the pelvis was performed following the standard protocol without intravenous contrast. COMPARISON:  Apr 24, 2011 CT the abdomen and pelvis FINDINGS: Urinary Tract:  The bladder is unremarkable. Bowel:  Visualized bowel is unremarkable. Vascular/Lymphatic: No pathologically enlarged lymph nodes. No significant vascular abnormality seen. Reproductive:  The patient is status post hysterectomy. Other:  No other abnormalities. Musculoskeletal: There is an abscess in the medial deep right buttocks which extends into the perianal location. The abscess measures up to 8.7 x 4.7 cm on series 2, image 127. Soft tissue gas does extend across midline to the left as seen on coronal image 133. The abscess does not abut the bones and there is no bony erosion to suggest osteomyelitis. There is skin thickening overlying the abscess. The remainder of the bones are unremarkable other than degenerative changes at L5-S1. IMPRESSION: Abscess in the deep medial right buttocks extending  into a perianal location with minimal extension across midline to the left. No osteomyelitis identified. Electronically Signed   By: Gerome Samavid  Williams III M.D   On: 10/11/2016 20:41    Anti-infectives: Anti-infectives    Start     Dose/Rate Route Frequency Ordered Stop   10/12/16 0400  piperacillin-tazobactam (ZOSYN) IVPB 3.375 g     3.375 g 12.5 mL/hr over 240 Minutes Intravenous Every 8 hours 10/12/16 0142     10/11/16 2045  vancomycin (VANCOCIN) IVPB 1000 mg/200 mL premix     1,000 mg 200 mL/hr over 60 Minutes Intravenous  Once 10/11/16 2036 10/11/16 2206   10/11/16  2045  vancomycin (VANCOCIN) IVPB 1000 mg/200 mL premix     1,000 mg 200 mL/hr over 60 Minutes Intravenous  Once 10/11/16 2036 10/11/16 2314   10/11/16 2000  vancomycin (VANCOCIN) 2,000 mg in sodium chloride 0.9 % 500 mL IVPB  Status:  Discontinued     2,000 mg 250 mL/hr over 120 Minutes Intravenous  Once 10/11/16 1955 10/11/16 2036   10/11/16 2000  piperacillin-tazobactam (ZOSYN) IVPB 3.375 g     3.375 g 100 mL/hr over 30 Minutes Intravenous  Once 10/11/16 1955 10/11/16 2035       Ashlye Oviedo J 10/13/2016

## 2016-10-13 NOTE — Progress Notes (Signed)
Date: October 13, 2016 Discharge orders checked for needs. No case management needs present at time of discharge. Marcelle Smilinghonda Davis, RN, BSN, [place]   [phone]

## 2016-10-13 NOTE — Discharge Summary (Signed)
Physician Discharge Summary  Patient ID: Jordan Ellison MRN: [id number] DOB/AGE: 52/10/1966 52 y.o.  Admit date: 10/11/2016 Discharge date: 10/13/2016  Admission Diagnoses:  Perirectal abscess  Discharge Diagnoses:  Principal Problem:   Perirectal abscess-s/p I & D 10/12/16    Discharged Condition: good  Hospital Course: She was admitted, given IV abxs and taken to the OR for the above procedure which she tolerated well.  She was able to be discharged on POD #1 on oral abxs and with wound care instructions.    Discharge Exam: Blood pressure 114/64, pulse 64, temperature 98 F (36.7 C), temperature source Oral, resp. rate 16, height 5\' 3"  (1.6 m), weight 106.6 kg (235 lb), SpO2 95 %.   Disposition: Final discharge disposition not confirmed     Medication List    TAKE these medications   ALPRAZolam 0.25 MG tablet Commonly known as:  XANAX Take 1 tablet (0.25 mg total) by mouth 2 (two) times daily as needed for anxiety.   benzonatate 100 MG capsule Commonly known as:  TESSALON Take 1 capsule by mouth 3 (three) times daily as needed for cough.   cefdinir 300 MG capsule Commonly known as:  OMNICEF Take 300 mg by mouth 2 (two) times daily.   ciprofloxacin 500 MG tablet Commonly known as:  CIPRO Take 1 tablet (500 mg total) by mouth 2 (two) times daily.   FOLIC ACID PO Take 600 mg by mouth daily.   MAGNESIUM PO Take 400 mg by mouth 2 (two) times daily.   meclizine 25 MG tablet Commonly known as:  ANTIVERT Take 1 tablet (25 mg total) by mouth 3 (three) times daily as needed.   metroNIDAZOLE 500 MG tablet Commonly known as:  FLAGYL Take 1 tablet (500 mg total) by mouth 3 (three) times daily.   ondansetron 4 MG tablet Commonly known as:  ZOFRAN Take 1 tablet (4 mg total) by mouth every 8 (eight) hours as needed for nausea or vomiting.   ranitidine 150 MG tablet Commonly known as:  ZANTAC Take 1 tablet (150 mg total) by mouth 2 (two) times daily as needed for  heartburn.   SYSTANE OP Apply 1-2 drops to eye daily as needed (dry eyes).   traMADol 50 MG tablet Commonly known as:  ULTRAM Take 1-2 tablets (50-100 mg total) by mouth every 6 (six) hours as needed.   TRUNATURE DIGESTIVE PROBIOTIC Caps Take 1 tablet by mouth daily.        Signed: Adolph PollackOSENBOWER,Eisa Necaise J 10/13/2016, 8:34 AM

## 2016-10-14 LAB — AEROBIC/ANAEROBIC CULTURE W GRAM STAIN (SURGICAL/DEEP WOUND): Special Requests: NORMAL

## 2016-10-14 LAB — AEROBIC/ANAEROBIC CULTURE (SURGICAL/DEEP WOUND)

## 2016-10-14 LAB — HEMOGLOBIN A1C
Hgb A1c MFr Bld: 5.9 % — ABNORMAL HIGH (ref 4.8–5.6)
Mean Plasma Glucose: 123 mg/dL

## 2016-10-16 LAB — CULTURE, BLOOD (ROUTINE X 2)
Culture: NO GROWTH
Culture: NO GROWTH
